# Patient Record
Sex: Female | Born: 1975 | Hispanic: Yes | Marital: Single | Smoking: Never smoker
Health system: Southern US, Community
[De-identification: ages and names within clinical notes are randomized; demographics above are authoritative.]

## PROBLEM LIST (undated history)

## (undated) DIAGNOSIS — D649 Anemia, unspecified: Secondary | ICD-10-CM

## (undated) DIAGNOSIS — K668 Other specified disorders of peritoneum: Secondary | ICD-10-CM

## (undated) DIAGNOSIS — R51 Headache: Secondary | ICD-10-CM

## (undated) DIAGNOSIS — R519 Headache, unspecified: Secondary | ICD-10-CM

## (undated) DIAGNOSIS — K219 Gastro-esophageal reflux disease without esophagitis: Secondary | ICD-10-CM

## (undated) HISTORY — DX: Other specified disorders of peritoneum: K66.8

---

## 2003-09-15 HISTORY — PX: TUBAL LIGATION: SHX77

## 2004-08-19 ENCOUNTER — Observation Stay: Payer: Self-pay

## 2004-08-21 ENCOUNTER — Inpatient Hospital Stay: Payer: Self-pay

## 2005-12-31 ENCOUNTER — Emergency Department: Payer: Self-pay | Admitting: Emergency Medicine

## 2006-06-03 ENCOUNTER — Emergency Department: Payer: Self-pay | Admitting: Emergency Medicine

## 2013-06-27 ENCOUNTER — Emergency Department: Payer: Self-pay | Admitting: Emergency Medicine

## 2013-06-27 LAB — BASIC METABOLIC PANEL
Anion Gap: 7 (ref 7–16)
BUN: 8 mg/dL (ref 7–18)
Calcium, Total: 9.7 mg/dL (ref 8.5–10.1)
Chloride: 105 mmol/L (ref 98–107)
Co2: 24 mmol/L (ref 21–32)
EGFR (African American): >60
EGFR (Non-African Amer.): >60
Osmolality: 270 (ref 275–301)
Potassium: 3.8 mmol/L (ref 3.5–5.1)

## 2013-06-27 LAB — CBC
HCT: 41.1 % (ref 35.0–47.0)
HGB: 14.1 g/dL (ref 12.0–16.0)
MCH: 30.1 pg (ref 26.0–34.0)
MCHC: 34.2 g/dL (ref 32.0–36.0)
MCV: 88 fL (ref 80–100)
Platelet: 292 10*3/uL (ref 150–440)
RDW: 12.7 % (ref 11.5–14.5)
WBC: 6.6 10*3/uL (ref 3.6–11.0)

## 2013-06-27 LAB — TROPONIN I: Troponin-I: <0.02 ng/mL

## 2015-05-28 ENCOUNTER — Other Ambulatory Visit: Payer: Self-pay | Admitting: Physician Assistant

## 2015-05-28 DIAGNOSIS — R1904 Left lower quadrant abdominal swelling, mass and lump: Secondary | ICD-10-CM

## 2015-05-31 ENCOUNTER — Ambulatory Visit
Admission: RE | Admit: 2015-05-31 | Discharge: 2015-05-31 | Disposition: A | Discharge status: Home / Self Care | Payer: 59 | Source: Ambulatory Visit | Attending: Physician Assistant | Admitting: Physician Assistant

## 2015-05-31 DIAGNOSIS — R1904 Left lower quadrant abdominal swelling, mass and lump: Secondary | ICD-10-CM | POA: Insufficient documentation

## 2015-06-06 ENCOUNTER — Other Ambulatory Visit: Payer: Self-pay | Admitting: Physician Assistant

## 2015-06-06 DIAGNOSIS — R1904 Left lower quadrant abdominal swelling, mass and lump: Secondary | ICD-10-CM

## 2015-06-14 ENCOUNTER — Ambulatory Visit
Admission: RE | Admit: 2015-06-14 | Discharge: 2015-06-14 | Disposition: A | Discharge status: Home / Self Care | Payer: 59 | Source: Ambulatory Visit | Attending: Physician Assistant | Admitting: Physician Assistant

## 2015-06-14 DIAGNOSIS — R1904 Left lower quadrant abdominal swelling, mass and lump: Secondary | ICD-10-CM

## 2015-06-14 DIAGNOSIS — N9489 Other specified conditions associated with female genital organs and menstrual cycle: Secondary | ICD-10-CM | POA: Insufficient documentation

## 2015-06-14 MED ORDER — IOHEXOL 300 MG/ML  SOLN
100.0000 mL | Freq: Once | INTRAMUSCULAR | Status: AC | PRN
  Administered 2015-06-14: 100 mL via INTRAVENOUS

## 2015-08-15 ENCOUNTER — Encounter: Payer: Self-pay | Admitting: *Deleted

## 2015-08-26 ENCOUNTER — Telehealth: Payer: Self-pay | Admitting: *Deleted

## 2015-08-26 NOTE — Telephone Encounter (Signed)
I have called patient twice to inform her that her [employer] ref was not done, could not get in touch with patient, voicemail not set up and I had to contact [facility] for that, had to leave message for [place]. Also sent [facility] a fax on 08/20/15 regarding ref. I have not heard back from them, so patients appointment was canceled. Can r/s when we receive the referral.

## 2015-08-27 ENCOUNTER — Ambulatory Visit (INDEPENDENT_AMBULATORY_CARE_PROVIDER_SITE_OTHER): Payer: 59 | Admitting: General Surgery

## 2015-08-27 ENCOUNTER — Encounter: Payer: Self-pay | Admitting: General Surgery

## 2015-08-27 ENCOUNTER — Ambulatory Visit: Payer: Self-pay | Admitting: General Surgery

## 2015-08-27 VITALS — BP 116/62 | HR 64 | Resp 12 | Ht 62.0 in | Wt 140.0 lb

## 2015-08-27 DIAGNOSIS — K668 Other specified disorders of peritoneum: Secondary | ICD-10-CM

## 2015-08-27 NOTE — Progress Notes (Signed)
[name], female   DOB: [birth year], 39 y.o.   MRN: [id number]  Chief Complaint  Patient presents with  . Abdominal Pain    mass    HPI Joan Hall is a 39 y.o. female here today for left lower abdominal pain. She noticed occasional pain that started about 4 months ago. She states the pain is similar to "contractions" lasting less than minutes. The pain is usually about once a week. No nausea or vomiting.  Ultrasound was 05-31-15 and CT scan on 06-14-15 showing a left lower quadrant cyst. She does feel the area is smaller than before. I have reviewed the history of present illness with the patient. Interpreter, Chip Boer, present for interview, exam and discussion.   HPI  History reviewed. No pertinent past medical history.  Past Surgical History  Procedure Laterality Date  . Tubal ligation  2005    History reviewed. No pertinent family history.  Social History Social History  Substance Use Topics  . Smoking status: Never Smoker   . Smokeless tobacco: Never Used  . Alcohol Use: No    No Known Allergies  Current Outpatient Prescriptions  Medication Sig Dispense Refill  . cholecalciferol (VITAMIN D) 1000 UNITS tablet Take 1,000 Units by mouth daily.    . fexofenadine (ALLEGRA) 30 MG tablet Take 30 mg by mouth daily.     No current facility-administered medications for this visit.    Review of Systems Review of Systems  Constitutional: Negative.   Respiratory: Negative.   Cardiovascular: Negative.   Gastrointestinal: Positive for abdominal pain. Negative for nausea, vomiting, diarrhea and constipation.    Blood pressure 116/62, pulse 64, resp. rate 12, height _0  (1.575 m), weight 140 lb (63.504 kg), last menstrual period 08/23/2015.  Physical Exam Physical Exam  Constitutional: She is oriented to person, place, and time. She appears well-developed and well-nourished.  HENT:  Mouth/Throat: Oropharynx is clear and moist.  Eyes:  Conjunctivae are normal. No scleral icterus.  Neck: Neck supple.  Cardiovascular: Normal rate, regular rhythm and normal heart sounds.   Pulmonary/Chest: Effort normal and breath sounds normal.  Abdominal: Soft. Normal appearance and bowel sounds are normal. She exhibits mass.  4-5 cm deep seated mass left lower quadrant.  Lymphadenopathy:    She has no cervical adenopathy.    She has no axillary adenopathy.       Right: No inguinal adenopathy present.       Left: No inguinal adenopathy present.  Neurological: She is alert and oriented to person, place, and time.  Skin: Skin is warm and dry.  Psychiatric: Her behavior is normal.    Data Reviewed Progress notes, CT scan and abdominal ultrasound reviewed.  Assessment    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 Pt states the mild discomfort is not bad enough to warrant any intervention. She is comfortable with periodic follow up.    Plan    Dicussed risk and benefits and options of aspiration, excision or observation. Patient wishes to observe for now and follow up in 4 months with repeat abdominal CT scan and office visit.    Patient is scheduled for a CT abdomen/pelvis with oral contrast only at [facility] on 11/26/15 at 10:00 am. She will pick up a prep kit for this. She is to arrive by 9:45 am and report to the registration desk. She will have nothing to eat or drink after midnight the night before. Patient is aware of date, time, and instructions. An interpreter  has been scheduled for this exam.   PCP:  Pierre Bali 08/27/2015, 11:39 AM

## 2015-08-27 NOTE — Patient Instructions (Addendum)
The patient is aware to call back for any questions or concerns. Follow up in 4 months with CT and office visit.  Patient is scheduled for a CT abdomen/pelvis with oral contrast only at [facility] on 11/26/15 at 10:00 am. She will pick up a prep kit for this. She is to arrive by 9:45 am and report to the registration desk. She will have nothing to eat or drink after midnight the night before. Patient is aware of date, time, and instructions. An interpreter has been scheduled for this exam. Patient placed in recalls for office visit f/u for April.

## 2015-09-19 ENCOUNTER — Other Ambulatory Visit: Payer: Self-pay

## 2015-09-19 ENCOUNTER — Emergency Department: Payer: BLUE CROSS/BLUE SHIELD

## 2015-09-19 ENCOUNTER — Encounter: Payer: Self-pay | Admitting: Emergency Medicine

## 2015-09-19 ENCOUNTER — Emergency Department
Admission: EM | Admit: 2015-09-19 | Discharge: 2015-09-19 | Disposition: A | Discharge status: Home / Self Care | Payer: BLUE CROSS/BLUE SHIELD | Attending: Emergency Medicine | Admitting: Emergency Medicine

## 2015-09-19 DIAGNOSIS — Z3202 Encounter for pregnancy test, result negative: Secondary | ICD-10-CM | POA: Diagnosis not present

## 2015-09-19 DIAGNOSIS — R1013 Epigastric pain: Secondary | ICD-10-CM | POA: Insufficient documentation

## 2015-09-19 DIAGNOSIS — Z79899 Other long term (current) drug therapy: Secondary | ICD-10-CM | POA: Diagnosis not present

## 2015-09-19 DIAGNOSIS — R110 Nausea: Secondary | ICD-10-CM | POA: Insufficient documentation

## 2015-09-19 DIAGNOSIS — R1010 Upper abdominal pain, unspecified: Secondary | ICD-10-CM | POA: Diagnosis present

## 2015-09-19 DIAGNOSIS — R1012 Left upper quadrant pain: Secondary | ICD-10-CM | POA: Diagnosis not present

## 2015-09-19 LAB — URINALYSIS COMPLETE WITH MICROSCOPIC (ARMC ONLY)
BILIRUBIN URINE: NEGATIVE
GLUCOSE, UA: NEGATIVE mg/dL
HGB URINE DIPSTICK: NEGATIVE
Ketones, ur: NEGATIVE mg/dL
Leukocytes, UA: NEGATIVE
NITRITE: NEGATIVE
Protein, ur: NEGATIVE mg/dL
SPECIFIC GRAVITY, URINE: 1.004 — AB (ref 1.005–1.030)
pH: 6 (ref 5.0–8.0)

## 2015-09-19 LAB — CBC
HCT: 36.8 % (ref 35.0–47.0)
Hemoglobin: 12.5 g/dL (ref 12.0–16.0)
MCH: 29.7 pg (ref 26.0–34.0)
MCHC: 33.9 g/dL (ref 32.0–36.0)
MCV: 87.5 fL (ref 80.0–100.0)
Platelets: 269 10*3/uL (ref 150–440)
RBC: 4.21 MIL/uL (ref 3.80–5.20)
RDW: 13.1 % (ref 11.5–14.5)
WBC: 6 10*3/uL (ref 3.6–11.0)

## 2015-09-19 LAB — COMPREHENSIVE METABOLIC PANEL
ALBUMIN: 3.9 g/dL (ref 3.5–5.0)
ALK PHOS: 72 U/L (ref 38–126)
ALT: 17 U/L (ref 14–54)
ANION GAP: 3 — AB (ref 5–15)
AST: 19 U/L (ref 15–41)
BILIRUBIN TOTAL: 0.3 mg/dL (ref 0.3–1.2)
BUN: 19 mg/dL (ref 6–20)
CALCIUM: 8.7 mg/dL — AB (ref 8.9–10.3)
CO2: 25 mmol/L (ref 22–32)
CREATININE: 0.62 mg/dL (ref 0.44–1.00)
Chloride: 108 mmol/L (ref 101–111)
GFR calc Af Amer: >60 mL/min (ref >60)
GFR calc non Af Amer: >60 mL/min (ref >60)
GLUCOSE: 111 mg/dL — AB (ref 65–99)
Potassium: 4.2 mmol/L (ref 3.5–5.1)
Sodium: 136 mmol/L (ref 135–145)
TOTAL PROTEIN: 6.8 g/dL (ref 6.5–8.1)

## 2015-09-19 LAB — PREGNANCY, URINE: PREG TEST UR: NEGATIVE

## 2015-09-19 LAB — LIPASE, BLOOD: Lipase: 37 U/L (ref 11–51)

## 2015-09-19 MED ORDER — ONDANSETRON 4 MG PO TBDP: 4.0000 mg | ORAL_TABLET | Freq: Three times a day (TID) | ORAL | Status: DC | PRN

## 2015-09-19 MED ORDER — SODIUM CHLORIDE 0.9 % IV BOLUS (SEPSIS)
1000.0000 mL | Freq: Once | INTRAVENOUS | Status: AC
  Administered 2015-09-19: 1000 mL via INTRAVENOUS

## 2015-09-19 MED ORDER — ONDANSETRON HCL 4 MG/2ML IJ SOLN
INTRAMUSCULAR | Status: AC
  Administered 2015-09-19: 4 mg via INTRAVENOUS
  Filled 2015-09-19: qty 2

## 2015-09-19 MED ORDER — PANTOPRAZOLE SODIUM 20 MG PO TBEC: 20.0000 mg | DELAYED_RELEASE_TABLET | Freq: Every day | ORAL | Status: DC

## 2015-09-19 MED ORDER — TRAMADOL HCL 50 MG PO TABS: 50.0000 mg | ORAL_TABLET | Freq: Four times a day (QID) | ORAL | Status: DC | PRN

## 2015-09-19 MED ORDER — MORPHINE SULFATE (PF) 4 MG/ML IV SOLN
4.0000 mg | Freq: Once | INTRAVENOUS | Status: AC
  Administered 2015-09-19: 4 mg via INTRAVENOUS

## 2015-09-19 MED ORDER — ONDANSETRON HCL 4 MG/2ML IJ SOLN
4.0000 mg | Freq: Once | INTRAMUSCULAR | Status: AC
  Administered 2015-09-19: 4 mg via INTRAVENOUS

## 2015-09-19 MED ORDER — MORPHINE SULFATE (PF) 4 MG/ML IV SOLN
INTRAVENOUS | Status: AC
  Administered 2015-09-19: 4 mg via INTRAVENOUS
  Filled 2015-09-19: qty 1

## 2015-09-19 NOTE — ED Notes (Signed)
Pt c/o LUQ pain that started today. Has had nausea but no vomiting.

## 2015-09-19 NOTE — ED Notes (Signed)
Reviewed d/c instructions, prescriptions, and follow-up care with patient with use of spanish translator. Pt verbalized understanding.

## 2015-09-19 NOTE — Discharge Instructions (Signed)
Dolor abdominal en adultos °(Abdominal Pain, Adult) °El dolor puede tener muchas causas. Normalmente la causa del dolor abdominal no es una enfermedad y mejorará sin tratamiento. Frecuentemente puede controlarse y tratarse en casa. Su médico le realizará un examen físico y posiblemente solicite análisis de sangre y radiografías para ayudar a determinar la gravedad de su dolor. Sin embargo, en muchos casos, debe transcurrir más tiempo antes de que se pueda encontrar una causa evidente del dolor. Antes de llegar a ese punto, es posible que su médico no sepa si necesita más pruebas o un tratamiento más profundo. °INSTRUCCIONES PARA EL CUIDADO EN EL HOGAR  °Esté atento al dolor para ver si hay cambios. Las siguientes indicaciones ayudarán a aliviar cualquier molestia que pueda sentir: °· Tome solo medicamentos de venta libre o recetados, según las indicaciones del médico. °· No tome laxantes a menos que se lo haya indicado su médico. °· Pruebe con una dieta líquida absoluta (caldo, té o agua) según se lo indique su médico. Introduzca gradualmente una dieta normal, según su tolerancia. °SOLICITE ATENCIÓN MÉDICA SI: °· Tiene dolor abdominal sin explicación. °· Tiene dolor abdominal relacionado con náuseas o diarrea. °· Tiene dolor cuando orina o defeca. °· Experimenta dolor abdominal que lo despierta de noche. °· Tiene dolor abdominal que empeora o mejora cuando come alimentos. °· Tiene dolor abdominal que empeora cuando come alimentos grasosos. °· Tiene fiebre. °SOLICITE ATENCIÓN MÉDICA DE INMEDIATO SI:  °· El dolor no desaparece en un plazo máximo de 2 horas. °· No deja de (vomitar). °· El dolor se siente solo en partes del abdomen, como el lado derecho o la parte inferior izquierda del abdomen. °· Evacúa materia fecal sanguinolenta o negra, de aspecto alquitranado. °ASEGÚRESE DE QUE: °· Comprende estas instrucciones. °· Controlará su afección. °· Recibirá ayuda de inmediato si no mejora o si empeora. °  °Esta  información no tiene como fin reemplazar el consejo del médico. Asegúrese de hacerle al médico cualquier pregunta que tenga. °  °Document Released: 08/31/2005 Document Revised: 09/21/2014 °Elsevier Interactive Patient Education ©2016 Elsevier Inc. ° °

## 2015-09-19 NOTE — ED Provider Notes (Signed)
[facility] Emergency Department Provider Note  Time seen: 8:26 PM  I have reviewed the triage vital signs and the nursing notes.   HISTORY  Chief Complaint Abdominal Pain    HPI Joan Hall is a 40 y.o. female with no past medical history who presents to the emergency department with upper abdominal pain. According to the patient this afternoon she ate some food, and approximately 30 minutes later developed upper abdominal pain. Patient states he abdominal pain is continued, along with nausea but has not vomited. Denies diarrhea or dysuria. Denies lower abdominal pain. Denies any history of abdominal pain in the past. Denies any known issues with her gallbladder. Describes her abdominal pain is moderate currently. Aching pain in her epigastrium.     History reviewed. No pertinent past medical history.  There are no active problems to display for this patient.   Past Surgical History  Procedure Laterality Date  . Tubal ligation  2005    Current Outpatient Rx  Name  Route  Sig  Dispense  Refill  . cholecalciferol (VITAMIN D) 1000 UNITS tablet   Oral   Take 1,000 Units by mouth daily.         . fexofenadine (ALLEGRA) 30 MG tablet   Oral   Take 30 mg by mouth daily.           Allergies Review of patient's allergies indicates no known allergies.  History reviewed. No pertinent family history.  Social History Social History  Substance Use Topics  . Smoking status: Never Smoker   . Smokeless tobacco: Never Used  . Alcohol Use: No    Review of Systems Constitutional: Negative for fever. Cardiovascular: Negative for chest pain. Respiratory: Negative for shortness of breath. Gastrointestinal: Positive for epigastric pain. Positive for nausea denies vomiting or diarrhea. Genitourinary: Negative for dysuria. Musculoskeletal: Negative for back pain Neurological: Negative for headache 10-point ROS otherwise  negative.  ____________________________________________   PHYSICAL EXAM:  VITAL SIGNS: ED Triage Vitals  Enc Vitals Group     BP 09/19/15 1827 128/74 mmHg     Pulse Rate 09/19/15 1827 69     Resp 09/19/15 1827 16     Temp 09/19/15 1827 97.8 F (36.6 C)     Temp Source 09/19/15 1827 Oral     SpO2 09/19/15 1827 99 %     Weight 09/19/15 1827 140 lb (63.504 kg)     Height 09/19/15 1827 5\' 3"  (1.6 m)     Head Cir --      Peak Flow --      Pain Score 09/19/15 1828 8     Pain Loc --      Pain Edu? --      Excl. in [place]? --     Constitutional: Alert and oriented. Well appearing and in no distress. Eyes: Normal exam ENT   Head: Normocephalic and atraumatic.   Mouth/Throat: Mucous membranes are moist. Cardiovascular: Normal rate, regular rhythm. No murmur Respiratory: Normal respiratory effort without tachypnea nor retractions. Breath sounds are clear and equal bilaterally. No wheezes/rales/rhonchi. Gastrointestinal: Soft, moderate epigastric tenderness palpation. No rebound or guarding. No distention. Musculoskeletal: Nontender with normal range of motion in all extremities. Neurologic:  Normal speech and language. No gross focal neurologic deficits  Skin:  Skin is warm, dry and intact.  Psychiatric: Mood and affect are normal. Speech and behavior are normal.  ____________________________________________    EKG  EKG reviewed and interpreted by myself shows normal sinus rhythm at  69 bpm, narrow QRS, normal axis, normal intervals, no ST changes. There is symmetrical in appearance with the EKG however overall appears quite normal.  ____________________________________________    RADIOLOGY  Ultrasound negative  ____________________________________________    INITIAL IMPRESSION / ASSESSMENT AND PLAN / ED COURSE  Pertinent labs & imaging results that were available during my care of the patient were reviewed by me and considered in my medical decision making (see  chart for details).  Patient with epigastric pain which began after eating tonight. Moderate tenderness to palpation. We will treat pain and nausea, obtain and ultrasound of her right upper quadrant to rule out biliary disease. Labs are within normal limits including LFTs.  Right upper quadrant ultrasound within normal limits. We'll discharge the patient home with pain and nausea medication primary care follow-up and strict abdominal pain return precautions.  ____________________________________________   FINAL CLINICAL IMPRESSION(S) / ED DIAGNOSES  Upper abdominal pain   Harvest Dark, MD 09/19/15 2150

## 2015-09-26 ENCOUNTER — Encounter: Payer: Self-pay | Admitting: *Deleted

## 2015-09-30 ENCOUNTER — Encounter: Payer: Self-pay | Admitting: *Deleted

## 2015-11-26 ENCOUNTER — Ambulatory Visit: Payer: BLUE CROSS/BLUE SHIELD

## 2015-12-10 ENCOUNTER — Ambulatory Visit: Payer: BLUE CROSS/BLUE SHIELD | Admitting: General Surgery

## 2016-01-09 ENCOUNTER — Encounter: Payer: Self-pay | Admitting: *Deleted

## 2016-02-25 ENCOUNTER — Other Ambulatory Visit: Payer: Self-pay | Admitting: Gastroenterology

## 2016-02-25 DIAGNOSIS — R1032 Left lower quadrant pain: Secondary | ICD-10-CM

## 2016-02-25 DIAGNOSIS — R1013 Epigastric pain: Secondary | ICD-10-CM | POA: Insufficient documentation

## 2016-02-25 DIAGNOSIS — K5909 Other constipation: Secondary | ICD-10-CM | POA: Insufficient documentation

## 2016-03-05 ENCOUNTER — Ambulatory Visit: Payer: BLUE CROSS/BLUE SHIELD

## 2016-03-10 ENCOUNTER — Ambulatory Visit
Admission: RE | Admit: 2016-03-10 | Discharge: 2016-03-10 | Disposition: A | Discharge status: Home / Self Care | Payer: BLUE CROSS/BLUE SHIELD | Source: Ambulatory Visit | Attending: Gastroenterology | Admitting: Gastroenterology

## 2016-03-10 DIAGNOSIS — R1032 Left lower quadrant pain: Secondary | ICD-10-CM | POA: Diagnosis present

## 2016-03-10 DIAGNOSIS — R935 Abnormal findings on diagnostic imaging of other abdominal regions, including retroperitoneum: Secondary | ICD-10-CM | POA: Insufficient documentation

## 2016-03-10 MED ORDER — IOPAMIDOL (ISOVUE-300) INJECTION 61%
100.0000 mL | Freq: Once | INTRAVENOUS | Status: AC | PRN
  Administered 2016-03-10: 100 mL via INTRAVENOUS

## 2016-03-31 ENCOUNTER — Ambulatory Visit: Payer: Self-pay | Admitting: General Surgery

## 2016-04-08 ENCOUNTER — Ambulatory Visit (INDEPENDENT_AMBULATORY_CARE_PROVIDER_SITE_OTHER): Payer: BLUE CROSS/BLUE SHIELD | Admitting: General Surgery

## 2016-04-08 ENCOUNTER — Encounter: Payer: Self-pay | Admitting: General Surgery

## 2016-04-08 VITALS — BP 122/78 | HR 82 | Resp 12 | Ht 62.0 in | Wt 152.0 lb

## 2016-04-08 DIAGNOSIS — IMO0002 Reserved for concepts with insufficient information to code with codable children: Secondary | ICD-10-CM

## 2016-04-08 DIAGNOSIS — K668 Other specified disorders of peritoneum: Secondary | ICD-10-CM

## 2016-04-08 NOTE — Patient Instructions (Signed)
The patient is aware to call back for any questions or concerns.  

## 2016-04-08 NOTE — Progress Notes (Signed)
[name], female   DOB: [birth year], 40 y.o.   MRN: [id number]  Chief Complaint  Patient presents with  . Follow-up    HPI Joan Hall is a 40 y.o. female presents with a follow up for a peritoneal cyst. The cyst was identified on a CT scan in January, 2017 following complaints of abdominal pain. Patient states today that the area is still painful and is in favor of doing something to relieve her pain. Patient's last CT scan was 02/29/2016. Interpreter Adin Hector was present for the duration of the interaction.  I have reviewed the history of present illness with the patient.  HPI  No past medical history on file.  Past Surgical History:  Procedure Laterality Date  . TUBAL LIGATION  2005    No family history on file.  Social History Social History  Substance Use Topics  . Smoking status: Never Smoker  . Smokeless tobacco: Never Used  . Alcohol use No    No Known Allergies  Current Outpatient Prescriptions  Medication Sig Dispense Refill  . cholecalciferol (VITAMIN D) 1000 UNITS tablet Take 1,000 Units by mouth daily.    . fexofenadine (ALLEGRA) 30 MG tablet Take 30 mg by mouth daily.     No current facility-administered medications for this visit.     Review of Systems Review of Systems  Constitutional: Negative.   Respiratory: Negative.   Cardiovascular: Negative.   Gastrointestinal: Positive for abdominal pain.    Blood pressure 122/78, pulse 82, resp. rate 12, height 5\' 2"  (1.575 m), weight 152 lb (68.9 kg), last menstrual period 02/26/2016.  Physical Exam Physical Exam  Constitutional: She is oriented to person, place, and time. She appears well-developed and well-nourished.  Eyes: Conjunctivae are normal. No scleral icterus.  Neck: Neck supple.  Cardiovascular: Normal rate, regular rhythm and intact distal pulses.   Pulmonary/Chest: Effort normal and breath sounds normal.  Abdominal: Soft. Normal appearance and bowel  sounds are normal. She exhibits mass (LLQ, tender). There is tenderness in the left lower quadrant. No hernia.    Lymphadenopathy:    She has no cervical adenopathy.  Neurological: She is alert and oriented to person, place, and time.  Skin: Skin is warm and dry.    Data Reviewed Progress notes, CT scan  Assessment    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 The cyst has grown 19% since her prior CT scan. In favor of removal based on symptomatic presentation and cyst growth.    Plan      Risks and benefits of cyst excision discussed with patient. All questions answered satisfactorily. She is agreeable to the procedure. Follow up TBD  This information has been scribed by Gaspar Cola CMA. PCP:  Ulis Rias 04/08/2016, 4:07 PM

## 2016-04-10 ENCOUNTER — Encounter: Payer: Self-pay | Admitting: General Surgery

## 2016-04-10 DIAGNOSIS — K668 Other specified disorders of peritoneum: Secondary | ICD-10-CM | POA: Insufficient documentation

## 2016-05-07 ENCOUNTER — Ambulatory Visit: Payer: BLUE CROSS/BLUE SHIELD | Admitting: General Surgery

## 2016-05-14 ENCOUNTER — Encounter: Payer: Self-pay | Admitting: General Surgery

## 2016-05-14 ENCOUNTER — Ambulatory Visit (INDEPENDENT_AMBULATORY_CARE_PROVIDER_SITE_OTHER): Payer: BLUE CROSS/BLUE SHIELD | Admitting: General Surgery

## 2016-05-14 VITALS — BP 126/70 | HR 70 | Resp 12 | Ht 63.0 in | Wt 151.0 lb

## 2016-05-14 DIAGNOSIS — K668 Other specified disorders of peritoneum: Secondary | ICD-10-CM

## 2016-05-14 DIAGNOSIS — N644 Mastodynia: Secondary | ICD-10-CM | POA: Diagnosis not present

## 2016-05-14 NOTE — Progress Notes (Signed)
[name], female   DOB: 08-05-1976, 40 y.o.   MRN: [id number]  Chief Complaint  Patient presents with  . Follow-up    HPI Joan Hall is a 40 y.o. female.  Here today for 6 weeks follow up abdominal pain. She states she has noticed a little more frequency of pain episodes like "comtractions" lasting 1 minute. She does admit to fatigue. Denies nausea or vomiting.  She does admit to left breast pain that started 2 days ago. Interpreter Leola Brazil, present for interview, exam and discussion. I have reviewed the history of present illness with the patient.  HPI  Past Medical History:  Diagnosis Date  . Peritoneal cyst     Past Surgical History:  Procedure Laterality Date  . TUBAL LIGATION  2005    No family history on file.  Social History Social History  Substance Use Topics  . Smoking status: Never Smoker  . Smokeless tobacco: Never Used  . Alcohol use No    No Known Allergies  Current Outpatient Prescriptions  Medication Sig Dispense Refill  . cholecalciferol (VITAMIN D) 1000 UNITS tablet Take 1,000 Units by mouth daily.    . fexofenadine (ALLEGRA) 30 MG tablet Take 30 mg by mouth daily.     No current facility-administered medications for this visit.     Review of Systems Review of Systems  Constitutional: Positive for fatigue.  Respiratory: Negative.   Cardiovascular: Negative.     Blood pressure 126/70, pulse 70, resp. rate 12, height 5\' 3"  (1.6 m), weight 151 lb (68.5 kg), last menstrual period 05/10/2016.  Physical Exam Physical Exam  Constitutional: She is oriented to person, place, and time. She appears well-developed and well-nourished.  HENT:  Mouth/Throat: Oropharynx is clear and moist.  Eyes: Conjunctivae are normal. No scleral icterus.  Neck: Neck supple.  Cardiovascular: Normal rate, regular rhythm and normal heart sounds.   Pulmonary/Chest: Effort normal and breath sounds normal. Right breast exhibits no  inverted nipple, no mass, no nipple discharge, no skin change and no tenderness. Left breast exhibits no inverted nipple, no mass, no nipple discharge, no skin change and no tenderness.  Abdominal: Soft. Normal appearance and bowel sounds are normal. She exhibits mass. There is tenderness.    Left abdominal mass  Lymphadenopathy:    She has no cervical adenopathy.    She has no axillary adenopathy.  Neurological: She is alert and oriented to person, place, and time.  Skin: Skin is warm and dry.  Psychiatric: Her behavior is normal.    Data Reviewed progress notes.  Assessment     Stable breast exam. 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 It is not related to pelvic structures. Best option is excision as it is symptomatic and enlarging     Plan       Risks and benefits of cyst excision discussed with patient. All questions answered satisfactorily. She is agreeable to the procedure. Return to work in one week post surgery if done  by laparoscopy.  The patient is scheduled for surgery at [facility] on 05/29/16. She will pre admit at the hospital on 05/21/16. The patient is aware of dates, time, and instructions.   This information has been scribed by Karie Fetch RN, BSN,BC.  Kallee Nam G 05/20/2016, 8:02 AM

## 2016-05-14 NOTE — Patient Instructions (Addendum)
The patient is aware to call back for any questions or concerns.   The patient is scheduled for surgery at [facility] on 05/29/16. She will pre admit at the hospital on 05/21/16. The patient is aware of dates, time, and instructions.

## 2016-05-20 ENCOUNTER — Encounter: Payer: Self-pay | Admitting: General Surgery

## 2016-05-21 ENCOUNTER — Encounter
Admission: RE | Admit: 2016-05-21 | Discharge: 2016-05-21 | Disposition: A | Discharge status: Home / Self Care | Payer: BLUE CROSS/BLUE SHIELD | Source: Ambulatory Visit | Attending: General Surgery | Admitting: General Surgery

## 2016-05-21 ENCOUNTER — Other Ambulatory Visit: Payer: Self-pay | Admitting: General Surgery

## 2016-05-21 DIAGNOSIS — Z01818 Encounter for other preprocedural examination: Secondary | ICD-10-CM | POA: Diagnosis present

## 2016-05-21 DIAGNOSIS — K668 Other specified disorders of peritoneum: Secondary | ICD-10-CM | POA: Insufficient documentation

## 2016-05-21 DIAGNOSIS — Z79899 Other long term (current) drug therapy: Secondary | ICD-10-CM | POA: Insufficient documentation

## 2016-05-21 DIAGNOSIS — Z9889 Other specified postprocedural states: Secondary | ICD-10-CM | POA: Diagnosis not present

## 2016-05-21 HISTORY — DX: Gastro-esophageal reflux disease without esophagitis: K21.9

## 2016-05-21 HISTORY — DX: Anemia, unspecified: D64.9

## 2016-05-21 HISTORY — DX: Headache: R51

## 2016-05-21 HISTORY — DX: Headache, unspecified: R51.9

## 2016-05-21 NOTE — Patient Instructions (Signed)
Your procedure is scheduled on: @ADMITDT2 @ May 29, 2016 (Friday) Su procedimiento est programado para: Report to Same  Day Surgery. (MEDICAL MALL) SECOND FLOOR Presntese a: To find out your arrival time please call [phone] between 1PM - 3PM on May 28, 2016 (Thursday). Para saber su hora de llegada por favor llame al [phone] entre la 1PM - 3PM el da:  Remember: Instructions that are not followed completely may result in serious medical risk, up to and including death, or upon the discretion of your surgeon and anesthesiologist your surgery may need to be rescheduled.  Recuerde: Las instrucciones que no se siguen completamente Heritage manager en un riesgo de salud grave, incluyendo hasta la [place] o a discrecin de su cirujano y Environmental health practitioner, su ciruga se puede posponer.   _x_ 1. Do not eat food or drink liquids after midnight. No gum chewing or hard candies.  No coma alimentos ni tome lquidos despus de la medianoche.  No mastique chicle ni caramelos  duros.     _x__ 2. No alcohol for 24 hours before or after surgery.    No tome alcohol durante las 24 horas antes ni despus de la [place].   ____ 3. Bring all medications with you on the day of surgery if instructed.    Lleve todos los medicamentos con usted el da de su ciruga si se le ha indicado as.   _x_ 4. Notify your doctor if there is any change in your medical condition (cold, fever,                             infections).    Informe a su mdico si hay algn cambio en su condicin mdica (resfriado, fiebre, infecciones).   Do not wear jewelry, make-up, hairpins, clips or nail polish.  No use joyas, maquillajes, pinzas/ganchos para el cabello ni esmalte de uas.  Do not wear lotions, powders, or perfumes. You may wear deodorant.  No use lociones, polvos o perfumes.  Puede usar desodorante.    Do not shave 48 hours prior to surgery. Men may shave face and neck.  No se afeite 48 horas antes de la  [place].  Los hombres pueden [employer] cara y el cuello.   Do not bring valuables to the hospital.   No lleve objetos [place] is not responsible for any belongings or valuables.  [place] no se hace responsable de ningn tipo de pertenencias u objetos de Geographical information systems officer.               Contacts, dentures or bridgework may not be worn into surgery.  Los lentes de [place], las dentaduras postizas o puentes no se pueden usar en la [place].  Leave your suitcase in the car. After surgery it may be brought to your room.  Deje su maleta en el auto.  Despus de la ciruga podr traerla a su habitacin.  For patients admitted to the hospital, discharge time is determined by your treatment team.  Para los pacientes que sean ingresados al hospital, el tiempo en el cual se le dar de alta es determinado por su                equipo de [place].   Patients discharged the day of surgery will not be allowed to drive home. A los pacientes que se les da de alta el mismo da de la ciruga no se les Engineer, production a  casa.   Please read over the following fact sheets that you were given: Por favor lea las siguientes hojas de informacin que le dieron:   Surgical Site Infection Prevention   ____ Take these medicines the morning of surgery with A SIP OF WATER:          Tome estas medicinas la maana de la ciruga con UN SORBO DE AGUA:  1.   2.   3.   4.       5.  6.  ____ Fleet Enema (as directed)          Enema de Fleet (segn lo indicado)    __x__ Use CHG Soap as directed          Utilice el jabn de CHG segn lo indicado  ____ Use inhalers on the day of surgery          Use los inhaladores el da de la ciruga  ____ Stop metformin 2 days prior to surgery          Deje de tomar el metformin 2 das antes de la ciruga    ____ Take 1/2 of usual insulin dose the night before surgery and none on the morning of surgery           Tome la mitad de la dosis habitual de  insulina la noche antes de la [place] y no tome nada en la maana de la             ciruga  __x_ Stop Coumadin/Plavix/aspirin on (NO ASPIRIN)          Deje de tomar el Coumadin/Plavix/aspirina el da:  _x___ Stop Anti-inflammatories on (NO MOTRIN, IBUPROFEN, ADVIL, ALEVE, BC, GOODYS , EXCEDRIN) TYLENOL OK TO TAKE FOR PAIN IF NEEDED          Deje de tomar antiinflamatorios el da:   ____ Stop supplements until after surgery            Deje de tomar suplementos hasta despus de la ciruga  ____ Bring C-Pap to the hospital          [place] al hospital

## 2016-05-29 ENCOUNTER — Encounter
Admission: RE | Disposition: A | Discharge status: Home / Self Care | Payer: Self-pay | Source: Ambulatory Visit | Attending: General Surgery

## 2016-05-29 ENCOUNTER — Ambulatory Visit: Payer: BLUE CROSS/BLUE SHIELD | Admitting: Anesthesiology

## 2016-05-29 ENCOUNTER — Ambulatory Visit
Admission: RE | Admit: 2016-05-29 | Discharge: 2016-05-29 | Disposition: A | Discharge status: Home / Self Care | Payer: BLUE CROSS/BLUE SHIELD | Source: Ambulatory Visit | Attending: General Surgery | Admitting: General Surgery

## 2016-05-29 DIAGNOSIS — Z79899 Other long term (current) drug therapy: Secondary | ICD-10-CM | POA: Insufficient documentation

## 2016-05-29 DIAGNOSIS — D649 Anemia, unspecified: Secondary | ICD-10-CM | POA: Insufficient documentation

## 2016-05-29 DIAGNOSIS — R51 Headache: Secondary | ICD-10-CM | POA: Diagnosis not present

## 2016-05-29 DIAGNOSIS — D200 Benign neoplasm of soft tissue of retroperitoneum: Secondary | ICD-10-CM | POA: Diagnosis not present

## 2016-05-29 DIAGNOSIS — K668 Other specified disorders of peritoneum: Secondary | ICD-10-CM

## 2016-05-29 HISTORY — PX: LAPAROSCOPIC REMOVAL ABDOMINAL MASS: SHX6360

## 2016-05-29 LAB — POCT PREGNANCY, URINE: PREG TEST UR: NEGATIVE

## 2016-05-29 SURGERY — REMOVAL, MASS, ABDOMEN, LAPAROSCOPIC
Anesthesia: General

## 2016-05-29 MED ORDER — CHLORHEXIDINE GLUCONATE CLOTH 2 % EX PADS
6.0000 | MEDICATED_PAD | Freq: Once | CUTANEOUS | Status: DC
Start: 2016-05-29 — End: 2016-05-29

## 2016-05-29 MED ORDER — FENTANYL CITRATE (PF) 100 MCG/2ML IJ SOLN
INTRAMUSCULAR | Status: DC | PRN
  Administered 2016-05-29: 100 ug via INTRAVENOUS

## 2016-05-29 MED ORDER — MEPERIDINE HCL 25 MG/ML IJ SOLN: 6.2500 mg | INTRAMUSCULAR | Status: DC | PRN

## 2016-05-29 MED ORDER — PROMETHAZINE HCL 25 MG/ML IJ SOLN
6.2500 mg | INTRAMUSCULAR | Status: DC | PRN
  Administered 2016-05-29: 6.25 mg via INTRAVENOUS

## 2016-05-29 MED ORDER — FENTANYL CITRATE (PF) 100 MCG/2ML IJ SOLN
INTRAMUSCULAR | Status: AC
  Filled 2016-05-29: qty 2

## 2016-05-29 MED ORDER — FAMOTIDINE 20 MG PO TABS
20.0000 mg | ORAL_TABLET | Freq: Once | ORAL | Status: AC
  Administered 2016-05-29: 20 mg via ORAL

## 2016-05-29 MED ORDER — LACTATED RINGERS IV SOLN
INTRAVENOUS | Status: DC
  Administered 2016-05-29 (×2): via INTRAVENOUS

## 2016-05-29 MED ORDER — HYDROMORPHONE HCL 1 MG/ML IJ SOLN
INTRAMUSCULAR | Status: DC | PRN
  Administered 2016-05-29: 1 mg via INTRAVENOUS
  Administered 2016-05-29: 0.5 mg via INTRAVENOUS

## 2016-05-29 MED ORDER — OXYCODONE HCL 5 MG PO TABS
ORAL_TABLET | ORAL | Status: AC
  Filled 2016-05-29: qty 1

## 2016-05-29 MED ORDER — SODIUM CHLORIDE 0.9 % IJ SOLN
INTRAMUSCULAR | Status: AC
  Filled 2016-05-29: qty 10

## 2016-05-29 MED ORDER — PROMETHAZINE HCL 25 MG/ML IJ SOLN
INTRAMUSCULAR | Status: AC
  Filled 2016-05-29: qty 1

## 2016-05-29 MED ORDER — OXYCODONE HCL 5 MG/5ML PO SOLN: 5.0000 mg | Freq: Once | ORAL | Status: AC | PRN

## 2016-05-29 MED ORDER — FENTANYL CITRATE (PF) 100 MCG/2ML IJ SOLN
25.0000 ug | INTRAMUSCULAR | Status: DC | PRN
  Administered 2016-05-29 (×3): 50 ug via INTRAVENOUS

## 2016-05-29 MED ORDER — FAMOTIDINE 20 MG PO TABS
ORAL_TABLET | ORAL | Status: AC
  Administered 2016-05-29: 20 mg via ORAL
  Filled 2016-05-29: qty 1

## 2016-05-29 MED ORDER — BUPIVACAINE-EPINEPHRINE (PF) 0.5% -1:200000 IJ SOLN
INTRAMUSCULAR | Status: AC
  Filled 2016-05-29: qty 30

## 2016-05-29 MED ORDER — PHENYLEPHRINE HCL 10 MG/ML IJ SOLN
INTRAMUSCULAR | Status: DC | PRN
  Administered 2016-05-29 (×3): 100 ug via INTRAVENOUS

## 2016-05-29 MED ORDER — BUPIVACAINE-EPINEPHRINE 0.5% -1:200000 IJ SOLN
INTRAMUSCULAR | Status: DC | PRN
  Administered 2016-05-29: 30 mL

## 2016-05-29 MED ORDER — CEFAZOLIN SODIUM-DEXTROSE 2-4 GM/100ML-% IV SOLN
2.0000 g | INTRAVENOUS | Status: AC
  Administered 2016-05-29: 2 g via INTRAVENOUS

## 2016-05-29 MED ORDER — ACETAMINOPHEN 10 MG/ML IV SOLN
INTRAVENOUS | Status: DC | PRN
  Administered 2016-05-29: 1000 mg via INTRAVENOUS

## 2016-05-29 MED ORDER — OXYCODONE-ACETAMINOPHEN 5-325 MG PO TABS: 1.0000 | ORAL_TABLET | ORAL | 0 refills | Status: AC | PRN

## 2016-05-29 MED ORDER — ONDANSETRON HCL 4 MG/2ML IJ SOLN
INTRAMUSCULAR | Status: DC | PRN
  Administered 2016-05-29: 4 mg via INTRAVENOUS

## 2016-05-29 MED ORDER — PROPOFOL 10 MG/ML IV BOLUS
INTRAVENOUS | Status: DC | PRN
  Administered 2016-05-29: 150 mg via INTRAVENOUS

## 2016-05-29 MED ORDER — OXYCODONE HCL 5 MG PO TABS
5.0000 mg | ORAL_TABLET | Freq: Once | ORAL | Status: AC | PRN
  Administered 2016-05-29: 5 mg via ORAL

## 2016-05-29 MED ORDER — DEXAMETHASONE SODIUM PHOSPHATE 10 MG/ML IJ SOLN
INTRAMUSCULAR | Status: DC | PRN
  Administered 2016-05-29: 4 mg via INTRAVENOUS

## 2016-05-29 MED ORDER — SUGAMMADEX SODIUM 200 MG/2ML IV SOLN
INTRAVENOUS | Status: DC | PRN
  Administered 2016-05-29: 150 mg via INTRAVENOUS

## 2016-05-29 MED ORDER — FENTANYL CITRATE (PF) 100 MCG/2ML IJ SOLN
50.0000 ug | Freq: Once | INTRAMUSCULAR | Status: AC
  Administered 2016-05-29: 50 ug via INTRAVENOUS

## 2016-05-29 MED ORDER — KETOROLAC TROMETHAMINE 30 MG/ML IJ SOLN
INTRAMUSCULAR | Status: DC | PRN
  Administered 2016-05-29: 15 mg via INTRAVENOUS

## 2016-05-29 MED ORDER — ACETAMINOPHEN 10 MG/ML IV SOLN
INTRAVENOUS | Status: AC
  Filled 2016-05-29: qty 100

## 2016-05-29 MED ORDER — ROCURONIUM BROMIDE 100 MG/10ML IV SOLN
INTRAVENOUS | Status: DC | PRN
  Administered 2016-05-29: 10 mg via INTRAVENOUS
  Administered 2016-05-29: 30 mg via INTRAVENOUS
  Administered 2016-05-29: 10 mg via INTRAVENOUS

## 2016-05-29 MED ORDER — CEFAZOLIN SODIUM-DEXTROSE 2-4 GM/100ML-% IV SOLN
INTRAVENOUS | Status: AC
  Administered 2016-05-29: 2 g via INTRAVENOUS
  Filled 2016-05-29: qty 100

## 2016-05-29 SURGICAL SUPPLY — 48 items
BLADE SURG 10 STRL SS SAFETY (BLADE) ×3 IMPLANT
BLADE SURG 11 STRL SS SAFETY (MISCELLANEOUS) ×3 IMPLANT
BLADE SURG 15 STRL SS SAFETY (BLADE) ×3 IMPLANT
CANISTER SUCT 1200ML W/VALVE (MISCELLANEOUS) ×3 IMPLANT
CANNULA DILATOR 10 W/SLV (CANNULA) ×2 IMPLANT
CANNULA DILATOR 10MM W/SLV (CANNULA) ×1
CATH TRAY 16F METER LATEX (MISCELLANEOUS) IMPLANT
CHLORAPREP W/TINT 26ML (MISCELLANEOUS) ×3 IMPLANT
CLEANER CAUTERY TIP 5X5 PAD (MISCELLANEOUS) IMPLANT
DEFOGGER SCOPE WARMER CLEARIFY (MISCELLANEOUS) IMPLANT
DRAPE INCISE IOBAN 66X45 STRL (DRAPES) ×3 IMPLANT
ELECT REM PT RETURN 9FT ADLT (ELECTROSURGICAL) ×3
ELECTRODE REM PT RTRN 9FT ADLT (ELECTROSURGICAL) ×1 IMPLANT
GLOVE BIO SURGEON STRL SZ7 (GLOVE) ×6 IMPLANT
GOWN STRL REUS W/ TWL LRG LVL3 (GOWN DISPOSABLE) ×2 IMPLANT
GOWN STRL REUS W/TWL LRG LVL3 (GOWN DISPOSABLE) ×4
GRASPER SUT TROCAR 14GX15 (MISCELLANEOUS) ×3 IMPLANT
HANDLE YANKAUER SUCT BULB TIP (MISCELLANEOUS) ×3 IMPLANT
HOLDER FOLEY CATH W/STRAP (MISCELLANEOUS) ×3 IMPLANT
IRRIGATION STRYKERFLOW (MISCELLANEOUS) IMPLANT
IRRIGATOR STRYKERFLOW (MISCELLANEOUS)
IV LACTATED RINGERS 1000ML (IV SOLUTION) IMPLANT
KIT RM TURNOVER STRD PROC AR (KITS) ×3 IMPLANT
LABEL OR SOLS (LABEL) IMPLANT
LIQUID BAND (GAUZE/BANDAGES/DRESSINGS) ×3 IMPLANT
NDL INSUFF ACCESS 14 VERSASTEP (NEEDLE) ×3 IMPLANT
NS IRRIG 500ML POUR BTL (IV SOLUTION) ×3 IMPLANT
PACK LAP CHOLECYSTECTOMY (MISCELLANEOUS) ×3 IMPLANT
PAD CLEANER CAUTERY TIP 5X5 (MISCELLANEOUS)
PENCIL ELECTRO HAND CTR (MISCELLANEOUS) IMPLANT
POUCH ENDO CATCH 10MM SPEC (MISCELLANEOUS) IMPLANT
SCISSORS METZENBAUM CVD 33 (INSTRUMENTS) ×3 IMPLANT
SHEARS HARMONIC ACE PLUS 36CM (ENDOMECHANICALS) IMPLANT
SLEEVE ENDOPATH XCEL 5M (ENDOMECHANICALS) ×3 IMPLANT
SPONGE KITTNER 5P (MISCELLANEOUS) ×3 IMPLANT
SPONGE LAP 18X18 5 PK (GAUZE/BANDAGES/DRESSINGS) IMPLANT
STAPLER SKIN PROX 35W (STAPLE) IMPLANT
SUT PDS AB 0 CT1 27 (SUTURE) ×6 IMPLANT
SUT SILK 3-0 (SUTURE)
SUT SILK 3-0 SH-1 18XCR BRD (SUTURE)
SUT VIC AB 0 SH 27 (SUTURE) ×3 IMPLANT
SUT VIC AB 2-0 CT1 27 (SUTURE) ×2
SUT VIC AB 2-0 CT1 TAPERPNT 27 (SUTURE) ×1 IMPLANT
SUT VIC AB 4-0 FS2 27 (SUTURE) ×6 IMPLANT
SUTURE SILK 3-0 SH-1 18XCR BRD (SUTURE) IMPLANT
SYR BULB IRRIG 60ML STRL (SYRINGE) IMPLANT
TROCAR XCEL NON-BLD 5MMX100MML (ENDOMECHANICALS) ×3 IMPLANT
TUBING INSUFFLATOR HI FLOW (MISCELLANEOUS) ×3 IMPLANT

## 2016-05-29 NOTE — Anesthesia Procedure Notes (Signed)
Procedure Name: Intubation Date/Time: 05/29/2016 7:48 AM Performed by: Rockne Coons Pre-anesthesia Checklist: Patient identified, Emergency Drugs available, Suction available, Patient being monitored and Timeout performed Patient Re-evaluated:Patient Re-evaluated prior to inductionOxygen Delivery Method: Circle system utilized Preoxygenation: Pre-oxygenation with 100% oxygen Intubation Type: IV induction Ventilation: Mask ventilation without difficulty Laryngoscope Size: Mac and 3 Grade View: Grade I Tube type: Oral Tube size: 7.0 mm Number of attempts: 1 Airway Equipment and Method: Stylet and Bite block Placement Confirmation: ETT inserted through vocal cords under direct vision,  positive ETCO2 and breath sounds checked- equal and bilateral Secured at: 21 cm Tube secured with: Tape Dental Injury: Teeth and Oropharynx as per pre-operative assessment

## 2016-05-29 NOTE — Interval H&P Note (Signed)
History and Physical Interval Note:  05/29/2016 7:11 AM  Joan Hall  has presented today for surgery, with the diagnosis of PERITONEAL CYST  The various methods of treatment have been discussed with the patient and family. After consideration of risks, benefits and other options for treatment, the patient has consented to  Procedure(s): LAPAROSCOPIC REMOVAL PERITONEAL CYST (N/A) as a surgical intervention .  The patient's history has been reviewed, patient examined, no change in status, stable for surgery.  I have reviewed the patient's chart and labs.  Questions were answered to the patient's satisfaction.     Yanet Balliet G

## 2016-05-29 NOTE — H&P (View-Only) (Signed)
[name], female   DOB: 16-Oct-1975, 40 y.o.   MRN: [id number]  Chief Complaint  Patient presents with  . Follow-up    HPI Joan Hall is a 40 y.o. female.  Here today for 6 weeks follow up abdominal pain. She states she has noticed a little more frequency of pain episodes like "comtractions" lasting 1 minute. She does admit to fatigue. Denies nausea or vomiting.  She does admit to left breast pain that started 2 days ago. Interpreter Joan Hall, present for interview, exam and discussion. I have reviewed the history of present illness with the patient.  HPI  Past Medical History:  Diagnosis Date  . Peritoneal cyst     Past Surgical History:  Procedure Laterality Date  . TUBAL LIGATION  2005    No family history on file.  Social History Social History  Substance Use Topics  . Smoking status: Never Smoker  . Smokeless tobacco: Never Used  . Alcohol use No    No Known Allergies  Current Outpatient Prescriptions  Medication Sig Dispense Refill  . cholecalciferol (VITAMIN D) 1000 UNITS tablet Take 1,000 Units by mouth daily.    . fexofenadine (ALLEGRA) 30 MG tablet Take 30 mg by mouth daily.     No current facility-administered medications for this visit.     Review of Systems Review of Systems  Constitutional: Positive for fatigue.  Respiratory: Negative.   Cardiovascular: Negative.     Blood pressure 126/70, pulse 70, resp. rate 12, height 5\' 3"  (1.6 m), weight 151 lb (68.5 kg), last menstrual period 05/10/2016.  Physical Exam Physical Exam  Constitutional: She is oriented to person, place, and time. She appears well-developed and well-nourished.  HENT:  Mouth/Throat: Oropharynx is clear and moist.  Eyes: Conjunctivae are normal. No scleral icterus.  Neck: Neck supple.  Cardiovascular: Normal rate, regular rhythm and normal heart sounds.   Pulmonary/Chest: Effort normal and breath sounds normal. Right breast exhibits no  inverted nipple, no mass, no nipple discharge, no skin change and no tenderness. Left breast exhibits no inverted nipple, no mass, no nipple discharge, no skin change and no tenderness.  Abdominal: Soft. Normal appearance and bowel sounds are normal. She exhibits mass. There is tenderness.    Left abdominal mass  Lymphadenopathy:    She has no cervical adenopathy.    She has no axillary adenopathy.  Neurological: She is alert and oriented to person, place, and time.  Skin: Skin is warm and dry.  Psychiatric: Her behavior is normal.    Data Reviewed progress notes.  Assessment     Stable breast exam. 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 It is not related to pelvic structures. Best option is excision as it is symptomatic and enlarging     Plan       Risks and benefits of cyst excision discussed with patient. All questions answered satisfactorily. She is agreeable to the procedure. Return to work in one week post surgery if done  by laparoscopy.  The patient is scheduled for surgery at [facility] on 05/29/16. She will pre admit at the hospital on 05/21/16. The patient is aware of dates, time, and instructions.   This information has been scribed by Karie Fetch RN, BSN,BC.  Nishan Ovens G 05/20/2016, 8:02 AM

## 2016-05-29 NOTE — Anesthesia Preprocedure Evaluation (Signed)
Anesthesia Evaluation  Patient identified by MRN, date of birth, ID band Patient awake    Reviewed: Allergy & Precautions, NPO status , Patient's Chart, lab work & pertinent test results  History of Anesthesia Complications Negative for: history of anesthetic complications  Airway Mallampati: II  TM Distance: >3 FB Neck ROM: Full    Dental no notable dental hx.    Pulmonary neg pulmonary ROS, neg sleep apnea, neg COPD,    breath sounds clear to auscultation- rhonchi (-) wheezing      Cardiovascular Exercise Tolerance: Good (-) hypertension(-) CAD and (-) Past MI  Rhythm:Regular Rate:Normal - Systolic murmurs and - Diastolic murmurs    Neuro/Psych  Headaches, negative psych ROS   GI/Hepatic Neg liver ROS, GERD  ,  Endo/Other  negative endocrine ROSneg diabetes  Renal/GU negative Renal ROS     Musculoskeletal   Abdominal (+) - obese,   Peds  Hematology  (+) anemia ,   Anesthesia Other Findings Past Medical History: No date: Anemia No date: GERD (gastroesophageal reflux disease) No date: Headache No date: Peritoneal cyst   Reproductive/Obstetrics                             Anesthesia Physical Anesthesia Plan  ASA: II  Anesthesia Plan: General   Post-op Pain Management:    Induction: Intravenous  Airway Management Planned: Oral ETT  Additional Equipment:   Intra-op Plan:   Post-operative Plan: Extubation in OR  Informed Consent: I have reviewed the patients History and Physical, chart, labs and discussed the procedure including the risks, benefits and alternatives for the proposed anesthesia with the patient or authorized representative who has indicated his/her understanding and acceptance.   Dental advisory given  Plan Discussed with: Anesthesiologist and CRNA  Anesthesia Plan Comments:         Anesthesia Quick Evaluation

## 2016-05-29 NOTE — Transfer of Care (Signed)
Immediate Anesthesia Transfer of Care Note  Patient: SAMIHA EASTRIDGE  Procedure(s) Performed: Procedure(s): LAPAROSCOPIC CONVERTED TO OPEN REMOVAL OF PERITONEAL CYST (N/A)  Patient Location: PACU  Anesthesia Type:General  Level of Consciousness: awake, alert  and oriented  Airway & Oxygen Therapy: Patient Spontanous Breathing  Post-op Assessment: Report given to RN, Post -op Vital signs reviewed and stable and Patient moving all extremities  Post vital signs: Reviewed and stable  Last Vitals:  Vitals:   05/29/16 0609 05/29/16 0906  BP: 120/62   Pulse: 72 73  Resp: 16   Temp: 36.2 C 36.3 C    Last Pain:  Vitals:   05/29/16 0906  TempSrc: Temporal         Complications: No apparent anesthesia complications

## 2016-05-29 NOTE — Discharge Instructions (Addendum)
AMBULATORY SURGERY  DISCHARGE INSTRUCTIONS   1) The drugs that you were given will stay in your system until tomorrow so for the next 24 hours you should not:  A) Drive an automobile B) Make any legal decisions C) Drink any alcoholic beverage   2) You may resume regular meals tomorrow.  Today it is better to start with liquids and gradually work up to solid foods.   3) Please notify your doctor immediately if you have any unusual bleeding, trouble breathing, redness and pain at the surgery site, drainage, fever, or pain not relieved by medication.   Please contact your physician with any problems or Same Day Surgery at [phone], Monday through Friday 6 am to 4 pm, or [place] at [facility] number at [phone].  Exploratory Laparotomy, Adult, Care After Refer to this sheet in the next few weeks. These instructions provide you with information about caring for yourself after your procedure. Your health care provider may also give you more specific instructions. Your treatment has been planned according to current medical practices, but problems sometimes occur. Call your health care provider if you have any problems or questions after your procedure. WHAT TO EXPECT AFTER THE PROCEDURE After your procedure, it is typical to have:  Abdominal soreness.  Fatigue.  A sore throat from tubes in your throat.  A lack of appetite. HOME CARE INSTRUCTIONS Medicines  Take medicines only as directed by your health care provider.  Do not drive or operate heavy machinery while taking pain medicine. Incision Care  There are many different ways to close and cover an incision, including stitches (sutures), skin glue, and adhesive strips. Follow your health care provider's instructions about:  Incision care.  Do not take showers or baths until your health care provider says that you can.  Check your incision area daily for signs of infection. Watch  for:  Redness.  Tenderness.  Swelling.  Drainage. Activities  Do not lift anything that is heavier than 10 pounds (4.5 kg) until your health care provider says that it is safe.  Try to walk a little bit each day if your health care provider says that it is okay.  Ask your health care provider when you can start to do your usual activities again, such as driving, going back to work, and having sex. Eating and Drinking  You may eat what you usually eat. Include lots of whole grains, fruits, and vegetables in your diet. This will help to prevent constipation.  Drink enough fluid to keep your urine clear or pale yellow. General Instructions  Keep all follow-up visits as directed by your health care provider. This is important. SEEK MEDICAL CARE IF:   You have a fever.  You have chills.  Your pain medicine is not helping.  You have constipation or diarrhea.  You have nausea or vomiting.  You have drainage, redness, swelling, or pain at your incision site. SEEK IMMEDIATE MEDICAL CARE IF:  Your pain is getting worse.  It has been more than 3 days since you been able to have a bowel movement.  You have ongoing (persistent) vomiting.  The edges of your incision open up.  You have warmth, tenderness, and swelling in your calf.  You have trouble breathing.  You have chest pain.   This information is not intended to replace advice given to you by your health care provider. Make sure you discuss any questions you have with your health care provider.   Document Released: 04/14/2004 Document  Revised: 09/21/2014 Document Reviewed: 04/18/2014 Elsevier Interactive Patient Education 2016 [place] exploratoria en adultos, cuidados posteriores (Exploratory Laparotomy, Adult, Care After) Siga estas instrucciones durante las prximas semanas. Estas indicaciones le proporcionan informacin acerca de cmo deber cuidarse despus del procedimiento. El mdico  tambin podr darle instrucciones ms especficas. El tratamiento ha sido planificado segn las prcticas mdicas actuales, pero en algunos casos pueden ocurrir problemas. Comunquese con el mdico si tiene algn problema o tiene dudas despus del procedimiento. QU ESPERAR DESPUS DEL PROCEDIMIENTO Despus del procedimiento, es normal tener los siguientes sntomas:  Dolor abdominal.  Fatiga.  Dolor de garganta debido a las cnulas.  Falta de apetito. [place] los medicamentos solamente como se lo haya indicado el mdico.  No conduzca ni opere maquinaria pesada mientras toma analgsicos. Cuidado de la incisin  Hay muchas maneras distintas de cerrar y [place] incisin, entre ellas, puntos (suturas), pegamento cutneo y tiras [place]. Siga las instrucciones del mdico con respecto a lo siguiente:  Careers adviser herida.  Cambiar y Charity fundraiser el vendaje.  Quitar el cierre de la incisin.  No se duche ni tome baos de inmersin hasta que el mdico lo autorice.  [place] zona de la incisin para detectar signos de infeccin. Est atento a lo siguiente:  Enrojecimiento.  Dolor a Secretary/administrator.  Hinchazn.  Secrecin. Actividades  No levante objetos que pesen ms de 10libras (4,5kg) hasta que el mdico le diga que es seguro Ruth.  Intente caminar un [place], si el mdico lo autoriza.  Pregntele al mdico cundo puede retomar sus actividades normales, por ejemplo, Forensic psychologist, regresar a Fish farm manager y Clinical biochemist. Comida y bebida  Puede seguir su dieta habitual. Riley Nearing en su dieta gran cantidad de cereales integrales, frutas y verduras. Esto ayudar a Contractor.  Beba suficiente lquido para Consulting civil engineer orina clara o de color amarillo plido. Instrucciones generales  Concurra a todas las visitas de control como se lo haya indicado el mdico. Esto es importante. SOLICITE  ATENCIN MDICA SI:   Jaclynn Guarneri.  Tiene escalofros.  El medicamento no Production designer, theatre/television/film.  Sufre estreimiento o diarrea.  Tiene nuseas o vmitos.  Hay secrecin, enrojecimiento, hinchazn o dolor en el lugar de la incisin. SOLICITE ATENCIN MDICA DE INMEDIATO SI:  El dolor empeora.  Han pasado ms de 3das desde que pudo defecar.  Tiene vmitos permanentes (persistentes).  Los bordes de la incisin se abren.  Siente calor y dolor a la palpacin en la pantorrilla, y la tiene hinchada.  Tiene dificultad para respirar.  Siente dolor en el pecho.   Esta informacin no tiene Marine scientist el consejo del mdico. Asegrese de hacerle al mdico cualquier pregunta que tenga.   Document Released: 08/17/2012 Document Revised: 09/21/2014 Elsevier Interactive Patient Education [employer].

## 2016-05-29 NOTE — Addendum Note (Signed)
Addendum  created 05/29/16 1102 by Rockne Coons, CRNA   Charge Capture section accepted

## 2016-05-29 NOTE — Anesthesia Postprocedure Evaluation (Signed)
Anesthesia Post Note  Patient: Joan Hall  Procedure(s) Performed: Procedure(s) (LRB): LAPAROSCOPIC CONVERTED TO OPEN REMOVAL OF PERITONEAL CYST (N/A)  Patient location during evaluation: PACU Anesthesia Type: General Level of consciousness: awake and alert and oriented Pain management: pain level controlled Vital Signs Assessment: post-procedure vital signs reviewed and stable Respiratory status: spontaneous breathing, nonlabored ventilation and respiratory function stable Cardiovascular status: blood pressure returned to baseline and stable Postop Assessment: no signs of nausea or vomiting Anesthetic complications: no    Last Vitals:  Vitals:   05/29/16 0951 05/29/16 0959  BP: 113/64   Pulse: 89 66  Resp: 14 11  Temp:      Last Pain:  Vitals:   05/29/16 0959  TempSrc:   PainSc: 4                  [name]

## 2016-05-29 NOTE — Op Note (Signed)
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  Complications: None  EBL: Less than 20 mL  Drains: None  Description: Patient was put to sleep in the supine position the operating table and a Foley catheter was inserted this catheter was removed to the end of the procedure. The abdomen was prepped and draped as sterile field and timeout was performed. Initial port incision was made along the upper lip of the umbilicus the Veress Veress needle was positioned in the peritoneal cavity and verified of the hanging drop method. Pneumoperitoneum was obtained followed by placement of a 10 mm port. The camera was introduced and the bladder was apparent that the cyst was occupying a space medial to the sigmoid colon. 2 right lateral 5 mm ports were placed and the sigmoid colon area was carefully exposed to reveal what appeared to be a mesenteric cyst. It was extremely thin-walled and covered with the some of the mesenteric blood flow to the bowel. It was felt the that this was better dealt with by direct laparotomy as opposed to attempted removal with the this laparoscope. The ports were then removed and a small vertical midline incision approximately 4-5 cm was made below the umbilicus. This was deepened through into the peritoneal cavity and bleeding controlled cautery. With careful retraction the sigmoid mesocolon was exposed in the cyst was then carefully freed from the surrounding mesenteric tissue of and using blunt and sharp dissection cautery and ligatures of 3-0 Vicryl. The cyst was completely removed. Its contents being thin clear fluid. After ensuring hemostasis the sigmoid colon was placed back in its position and covered with the omentum and abdomen closed. The peritoneal layer closed with a  running 0 Vicryl. The fascia closed with interrupted figure-of-eight stitches of his  0 PDS. 30 mL of 0.5% Marcaine was instilled in all wounds for postop analgesia. Subcutaneous tissue was closed with the 2-0 Vicryl. All skin incisions were closed with subcuticular 4-0 Vicryl and covered with Dermabond. Patient subsequently extubated and returned recovery room in stable condition

## 2016-06-01 LAB — SURGICAL PATHOLOGY

## 2016-06-04 ENCOUNTER — Telehealth: Payer: Self-pay | Admitting: *Deleted

## 2016-06-04 NOTE — Telephone Encounter (Signed)
-----   Message from Christene Lye, MD sent at 06/04/2016 10:31 AM EDT ----- Please let pt pt know the pathology was normal-benign cyst

## 2016-06-10 ENCOUNTER — Ambulatory Visit (INDEPENDENT_AMBULATORY_CARE_PROVIDER_SITE_OTHER): Payer: BLUE CROSS/BLUE SHIELD | Admitting: General Surgery

## 2016-06-10 ENCOUNTER — Encounter: Payer: Self-pay | Admitting: General Surgery

## 2016-06-10 VITALS — BP 120/82 | HR 80 | Resp 12 | Ht 63.0 in | Wt 150.0 lb

## 2016-06-10 DIAGNOSIS — K668 Other specified disorders of peritoneum: Secondary | ICD-10-CM

## 2016-06-10 NOTE — Telephone Encounter (Signed)
Notified patient as instructed, patient pleased. Discussed follow-up appointments, patient agrees  

## 2016-06-10 NOTE — Progress Notes (Signed)
[name], female   DOB: 06/23/1976, 40 y.o.   MRN: [id number]  Chief Complaint  Patient presents with  . Follow-up    HPI Joan Hall is a 40 y.o. female follow up open peritoneal cyst excision om 05-29-16.Patient states she is sore but doing well.  Inperter was present for visit.  I have reviewed the history of present illness with the patient.   HPI  Past Medical History:  Diagnosis Date  . Anemia   . GERD (gastroesophageal reflux disease)   . Headache   . Peritoneal cyst     Past Surgical History:  Procedure Laterality Date  . LAPAROSCOPIC REMOVAL ABDOMINAL MASS N/A 05/29/2016   Procedure: LAPAROSCOPIC CONVERTED TO OPEN REMOVAL OF PERITONEAL CYST;  Surgeon: Christene Lye, MD;  Location: [facility] ORS;  Service: General;  Laterality: N/A;  . TUBAL LIGATION  2005    No family history on file.  Social History Social History  Substance Use Topics  . Smoking status: Never Smoker  . Smokeless tobacco: Never Used  . Alcohol use No    No Known Allergies  Current Outpatient Prescriptions  Medication Sig Dispense Refill  . cholecalciferol (VITAMIN D) 1000 UNITS tablet Take 1,000 Units by mouth daily.    Marland Kitchen dicyclomine (BENTYL) 10 MG capsule Take 10 mg by mouth 4 (four) times daily as needed for spasms.    . fexofenadine (ALLEGRA) 30 MG tablet Take 180 mg by mouth daily.     Marland Kitchen oxyCODONE-acetaminophen (ROXICET) 5-325 MG tablet Take 1 tablet by mouth every 4 (four) hours as needed. 30 tablet 0   No current facility-administered medications for this visit.     Review of Systems Review of Systems  Constitutional: Negative.   Respiratory: Negative.   Cardiovascular: Negative.   Gastrointestinal: Positive for abdominal pain (mild pain with movement at incision site). Negative for abdominal distention, blood in stool, constipation, diarrhea, nausea and vomiting.    Blood pressure 120/82, pulse 80, resp. rate 12, height 5\' 3"  (1.6 m),  weight 150 lb (68 kg), last menstrual period 05/10/2016.  Physical Exam Physical Exam  Constitutional: She is oriented to person, place, and time. She appears well-developed and well-nourished.  Cardiovascular: Normal rate, regular rhythm and normal heart sounds.   Abdominal: Soft. Normal appearance.    Neurological: She is alert and oriented to person, place, and time.  Skin: Skin is warm and dry.  Psychiatric: Her behavior is normal.    Data Reviewed  Path- benign cystadenoma. Assessment    Post op excision of a mesenteric cyst in sigmoid mesocolon  Doing well  Plan   Pt advised on path report. No exertional activity.  In another week she may drive.  Follow up in 4 weeks.     The patient is aware to call back for any questions or concerns. This information has been scribed by Karie Fetch RN, BSN,BC.   Joan Hall 06/10/2016, 3:41 PM

## 2016-06-10 NOTE — Patient Instructions (Signed)
No exertional activity. In another week she may drive.  Follow up in 4 weeks.

## 2016-07-02 ENCOUNTER — Encounter: Payer: Self-pay | Admitting: General Surgery

## 2016-07-02 ENCOUNTER — Ambulatory Visit (INDEPENDENT_AMBULATORY_CARE_PROVIDER_SITE_OTHER): Payer: BLUE CROSS/BLUE SHIELD | Admitting: General Surgery

## 2016-07-02 ENCOUNTER — Encounter: Payer: Self-pay | Admitting: *Deleted

## 2016-07-02 VITALS — BP 116/76 | HR 80 | Resp 12 | Ht 60.0 in | Wt 150.0 lb

## 2016-07-02 DIAGNOSIS — K668 Other specified disorders of peritoneum: Secondary | ICD-10-CM

## 2016-07-02 NOTE — Patient Instructions (Addendum)
Patient to return as needed.The patient is aware to call back for any questions or concerns. Return to work.07/08/16.

## 2016-07-02 NOTE — Progress Notes (Signed)
[name], female   DOB: [birth year], 40 y.o.   MRN: [id number]  Chief Complaint  Patient presents with  . Follow-up    HPI Joan Hall is a 40 y.o. female follow up mesenteric cyst excision on 05-29-16.Patient states she is sore but doing well.  I have reviewed the history of present illness with the patient.  HPI  Past Medical History:  Diagnosis Date  . Anemia   . GERD (gastroesophageal reflux disease)   . Headache   . Peritoneal cyst     Past Surgical History:  Procedure Laterality Date  . LAPAROSCOPIC REMOVAL ABDOMINAL MASS N/A 05/29/2016   Procedure: LAPAROSCOPIC CONVERTED TO OPEN REMOVAL OF PERITONEAL CYST;  Surgeon: Christene Lye, MD;  Location: [facility] ORS;  Service: General;  Laterality: N/A;  . TUBAL LIGATION  2005    No family history on file.  Social History Social History  Substance Use Topics  . Smoking status: Never Smoker  . Smokeless tobacco: Never Used  . Alcohol use No    No Known Allergies  Current Outpatient Prescriptions  Medication Sig Dispense Refill  . cholecalciferol (VITAMIN D) 1000 UNITS tablet Take 1,000 Units by mouth daily.    Marland Kitchen dicyclomine (BENTYL) 10 MG capsule Take 10 mg by mouth 4 (four) times daily as needed for spasms.    . fexofenadine (ALLEGRA) 30 MG tablet Take 180 mg by mouth daily.     Marland Kitchen oxyCODONE-acetaminophen (ROXICET) 5-325 MG tablet Take 1 tablet by mouth every 4 (four) hours as needed. 30 tablet 0   No current facility-administered medications for this visit.     Review of Systems Review of Systems  Constitutional: Negative.   Respiratory: Negative.   Cardiovascular: Negative.     Blood pressure 116/76, pulse 80, resp. rate 12, height 5' (1.524 m), weight 150 lb (68 kg).  Physical Exam Physical Exam  Constitutional: She is oriented to person, place, and time. She appears well-developed and well-nourished.  Neurological: She is alert and oriented to person, place, and  time.  Skin: Skin is warm and dry.  Abdomen is soft, non tender. Incision well healed. No hernia noted  Data Reviewed  Path- benign cystadenoma.  Assessment       Post op excision of a mesenteric cyst in sigmoid mesocolon  Doing well  Plan       Patient to return as needed.The patient is aware to call back for any questions or concerns. Return to work on 07/08/16.   This information has been scribed by Gaspar Cola CMA.   Alphonse Asbridge G 07/02/2016, 3:16 PM

## 2016-11-16 ENCOUNTER — Other Ambulatory Visit: Payer: Self-pay | Admitting: Primary Care

## 2016-11-16 DIAGNOSIS — R1013 Epigastric pain: Secondary | ICD-10-CM

## 2016-11-18 ENCOUNTER — Ambulatory Visit
Admission: RE | Admit: 2016-11-18 | Discharge: 2016-11-18 | Disposition: A | Discharge status: Home / Self Care | Payer: BLUE CROSS/BLUE SHIELD | Source: Ambulatory Visit | Attending: Primary Care | Admitting: Primary Care

## 2016-11-18 DIAGNOSIS — R1013 Epigastric pain: Secondary | ICD-10-CM | POA: Diagnosis present

## 2016-12-29 ENCOUNTER — Other Ambulatory Visit: Payer: Self-pay | Admitting: Gastroenterology

## 2016-12-29 DIAGNOSIS — R1013 Epigastric pain: Secondary | ICD-10-CM

## 2017-01-06 ENCOUNTER — Ambulatory Visit
Admission: RE | Admit: 2017-01-06 | Discharge: 2017-01-06 | Disposition: A | Discharge status: Home / Self Care | Payer: BLUE CROSS/BLUE SHIELD | Source: Ambulatory Visit | Attending: Gastroenterology | Admitting: Gastroenterology

## 2017-01-06 ENCOUNTER — Other Ambulatory Visit: Payer: Self-pay | Admitting: Gastroenterology

## 2017-01-06 DIAGNOSIS — R1013 Epigastric pain: Secondary | ICD-10-CM | POA: Insufficient documentation

## 2017-01-06 DIAGNOSIS — K219 Gastro-esophageal reflux disease without esophagitis: Secondary | ICD-10-CM | POA: Diagnosis not present

## 2017-06-22 IMAGING — CR DG UGI W/ HIGH DENSITY W/KUB
15 of 22 series · 15 of 22 positions shown · non-contrast
Comparison: None.

CLINICAL DATA: Gastroesophageal reflux with pain

EXAM:
UPPER GI SERIES WITH KUB
TECHNIQUE: After obtaining a scout radiograph a routine upper GI series was
performed using thin density barium.
FLUOROSCOPY TIME:  Fluoroscopy Time:  1 minutes 18 seconds
Radiation Exposure Index (if provided by the fluoroscopic device):
41.40 mGy
Number of Acquired Spot Images: 16

[t abdomen supine]
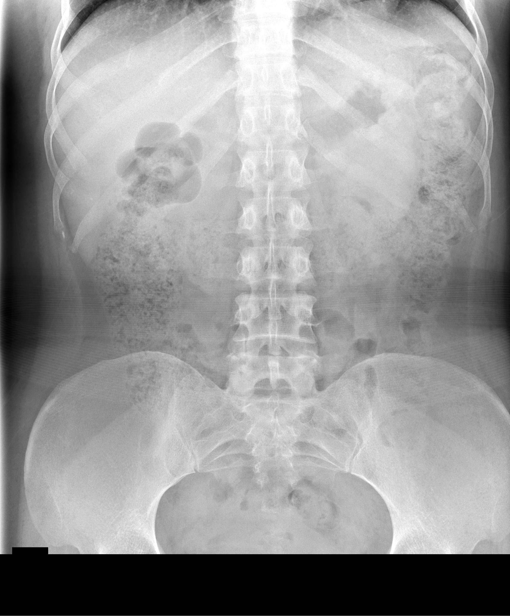

[fluoro_barium singleshot_bw (1 of 11)]
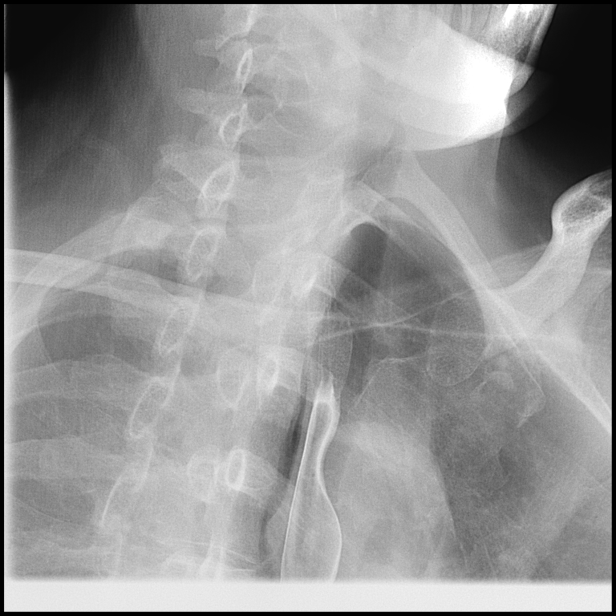

[fluoro_barium singleshot_bw (2 of 11)]
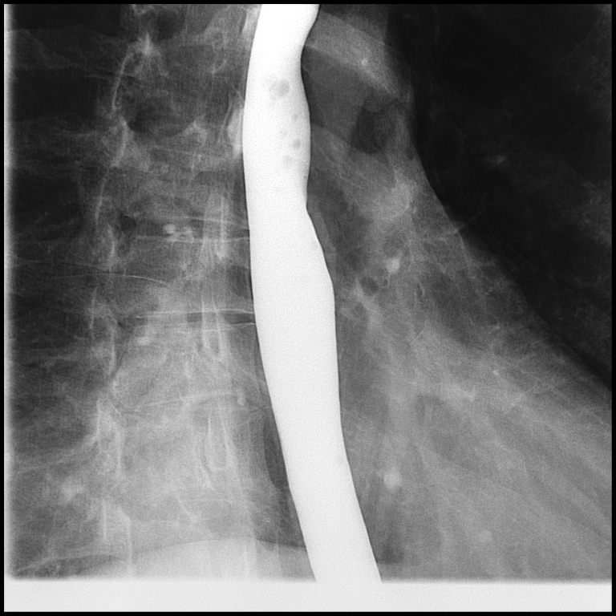

[fluoro_barium singleshot_bw (3 of 11)]
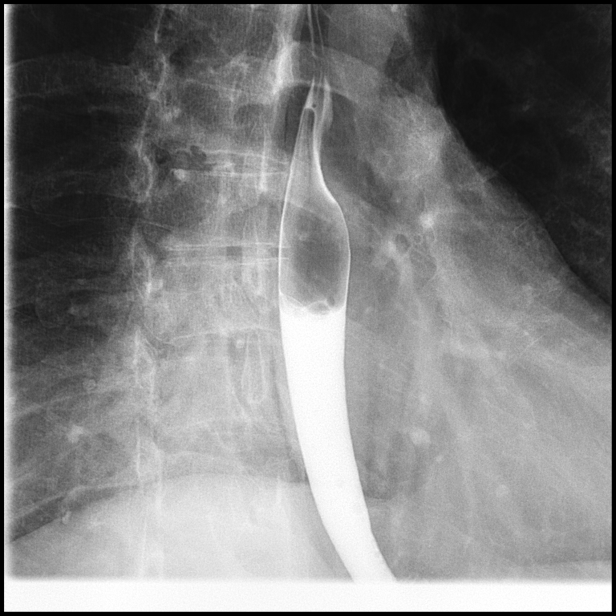

[fluoro_barium singleshot_bw (4 of 11)]
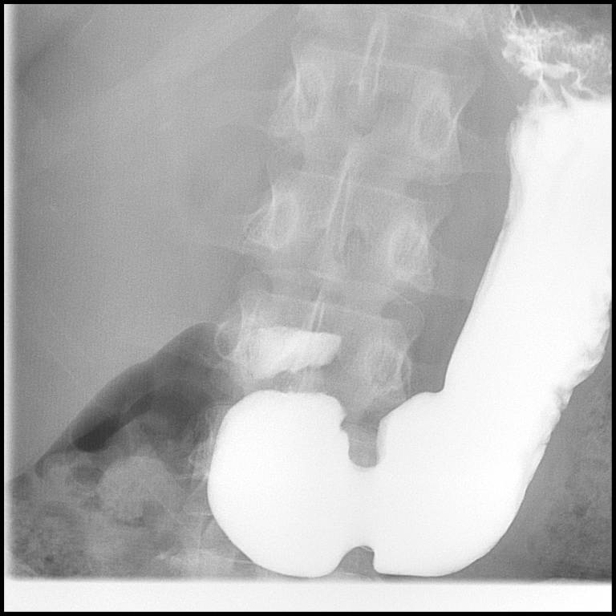

[fluoro_barium singleshot_bw (5 of 11)]
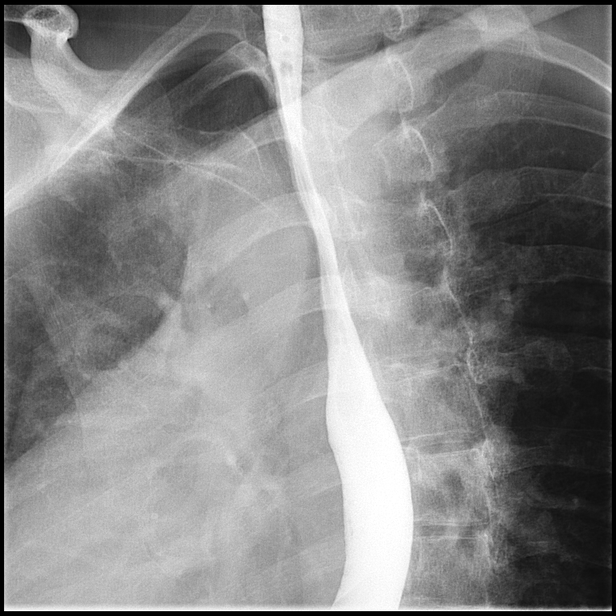

[fluoro_barium singleshot_bw (6 of 11)]
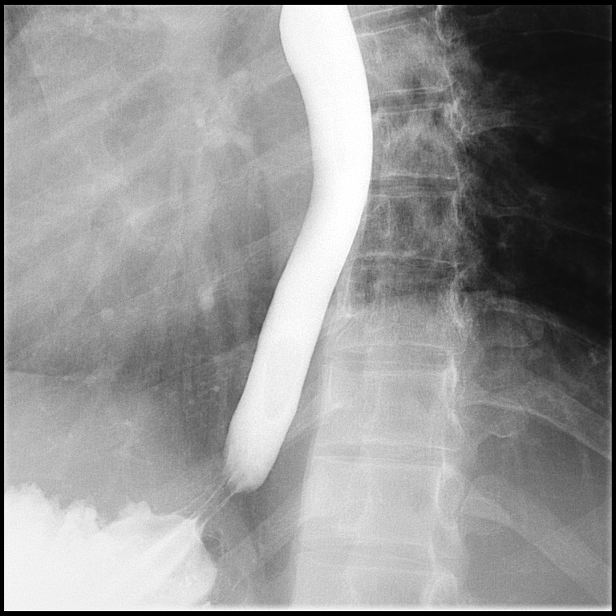

[fluoro_barium singleshot_bw (7 of 11)]
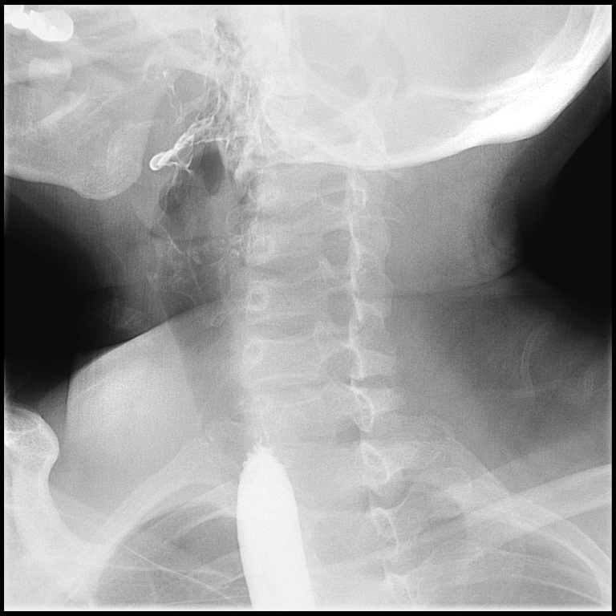

[fluoro_barium singleshot_bw (8 of 11)]
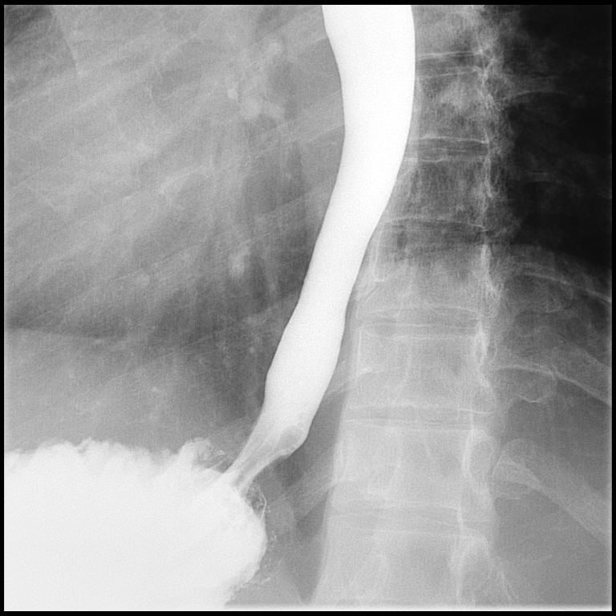

[fluoro_barium singleshot_bw (9 of 11)]
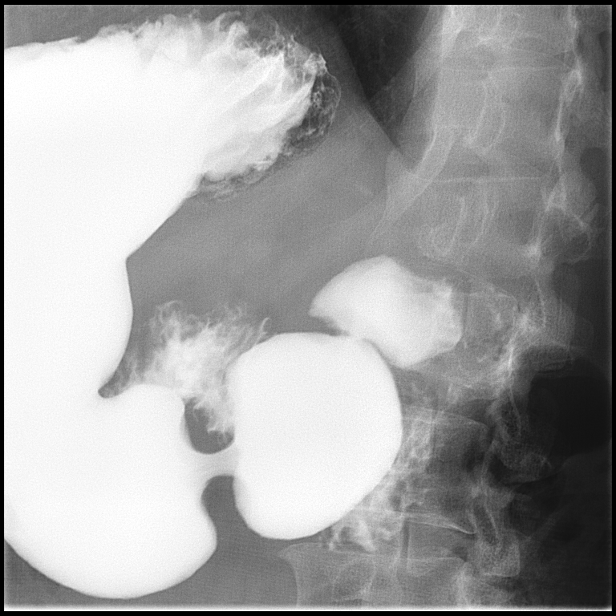

[fluoro_barium singleshot_bw (10 of 11)]
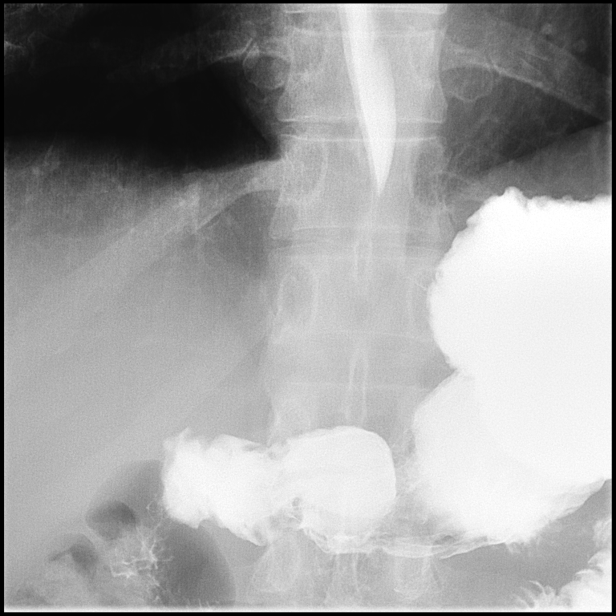

[fluoro_barium singleshot_bw (11 of 11)]
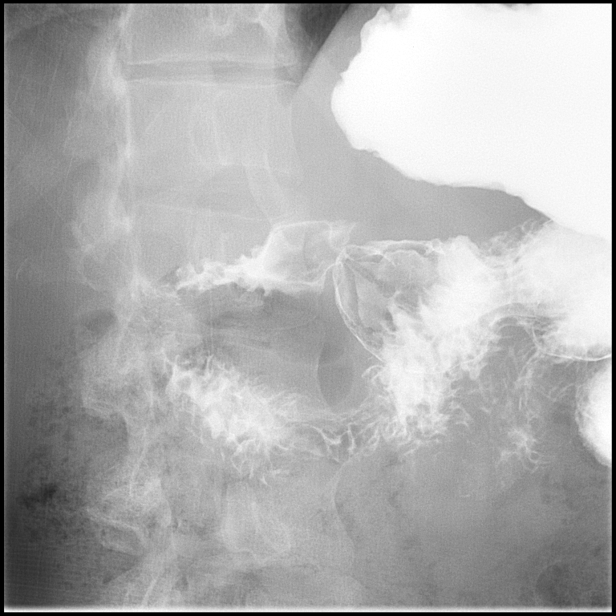

[t abdomen barium ap (1 of 3)]
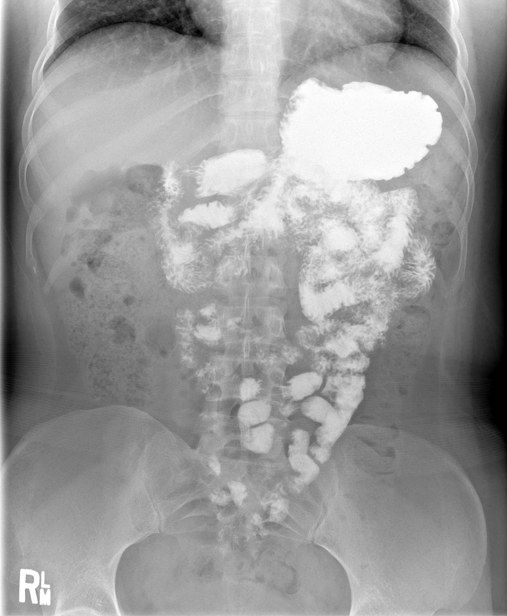

[t abdomen barium ap (2 of 3)]
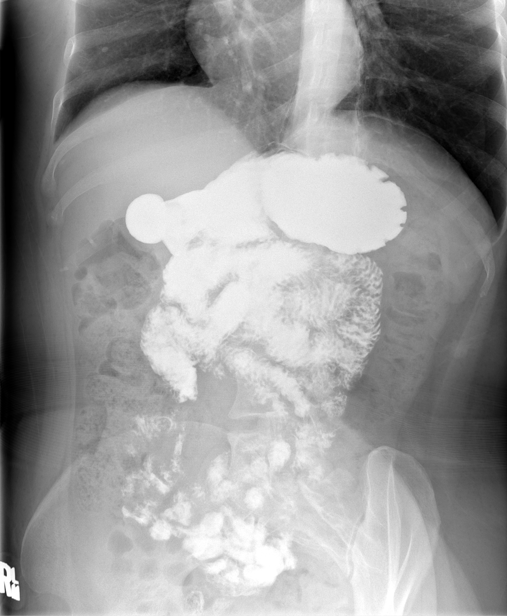

[t abdomen barium ap (3 of 3)]
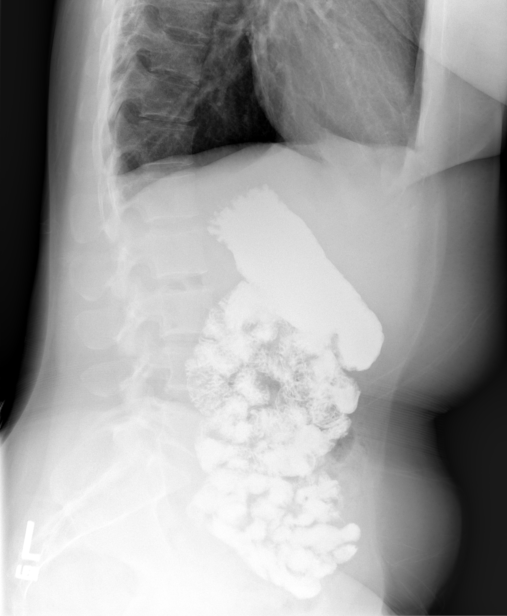

[15 of 22 positions shown; findings below may reference images not displayed]

FINDINGS: Preliminary abdomen radiograph shows diffuse stool throughout the
colon. The bowel gas pattern is unremarkable.

Esophagus, stomach, and duodenum were visualized. Swallowing and
peristalsis are normal. There is no hiatal hernia or stricture.
There is spontaneous reflux of a full column of barium to the
junction of the upper and mid thirds of the esophagus which empties
rather slowly. There is no esophageal mass or ulceration. Pharynx
appears unremarkable.

Stomach appears normal without mass or ulceration. Gastric folds
appears unremarkable. No duodenal mass or ulceration. Duodenal folds
appear normal.
IMPRESSION: Spontaneous gastroesophageal reflux to the junction of the upper and
mid thirds of the esophagus which empties rather slowly. No
esophageal mucosal lesion evident. No hiatal hernia or stricture.
Peristalsis normal. Stomach and duodenum appear normal.

## 2019-07-14 ENCOUNTER — Other Ambulatory Visit: Payer: Self-pay | Admitting: *Deleted

## 2019-07-14 DIAGNOSIS — Z20822 Contact with and (suspected) exposure to covid-19: Secondary | ICD-10-CM

## 2019-07-17 LAB — NOVEL CORONAVIRUS, NAA: SARS-CoV-2, NAA: NOT DETECTED

## 2020-06-07 ENCOUNTER — Other Ambulatory Visit: Payer: Self-pay

## 2020-06-07 ENCOUNTER — Emergency Department: Payer: BLUE CROSS/BLUE SHIELD

## 2020-06-07 ENCOUNTER — Emergency Department
Admission: EM | Admit: 2020-06-07 | Discharge: 2020-06-07 | Disposition: A | Discharge status: Home / Self Care | Payer: BLUE CROSS/BLUE SHIELD | Attending: Emergency Medicine | Admitting: Emergency Medicine

## 2020-06-07 ENCOUNTER — Encounter: Payer: Self-pay | Admitting: Intensive Care

## 2020-06-07 DIAGNOSIS — M7918 Myalgia, other site: Secondary | ICD-10-CM

## 2020-06-07 DIAGNOSIS — M545 Low back pain: Secondary | ICD-10-CM | POA: Diagnosis present

## 2020-06-07 MED ORDER — NAPROXEN 500 MG PO TABS
500.0000 mg | ORAL_TABLET | Freq: Once | ORAL | Status: AC
  Administered 2020-06-07: 500 mg via ORAL
  Filled 2020-06-07: qty 1

## 2020-06-07 MED ORDER — CYCLOBENZAPRINE HCL 10 MG PO TABS
10.0000 mg | ORAL_TABLET | Freq: Once | ORAL | Status: AC
  Administered 2020-06-07: 10 mg via ORAL
  Filled 2020-06-07: qty 1

## 2020-06-07 MED ORDER — MELOXICAM 15 MG PO TABS: 15.0000 mg | ORAL_TABLET | Freq: Every day | ORAL | 0 refills | Status: AC

## 2020-06-07 MED ORDER — CYCLOBENZAPRINE HCL 10 MG PO TABS: 5.0000 mg | ORAL_TABLET | Freq: Three times a day (TID) | ORAL | 0 refills | Status: AC | PRN
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
## 2020-06-07 NOTE — ED Notes (Signed)
Discharge instructions given per Rutherford Nail NP.

## 2020-06-07 NOTE — ED Notes (Signed)
Medication allergies verified via video interpreter.
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
## 2023-05-20 ENCOUNTER — Encounter: Payer: Self-pay | Admitting: Primary Care

## 2023-08-04 ENCOUNTER — Other Ambulatory Visit: Payer: Self-pay

## 2023-08-04 DIAGNOSIS — Z1231 Encounter for screening mammogram for malignant neoplasm of breast: Secondary | ICD-10-CM

## 2023-08-09 ENCOUNTER — Ambulatory Visit: Payer: Self-pay

## 2023-12-08 NOTE — Addendum Note (Signed)
 Addended by: Narda Rutherford on: 12/08/2023 01:16 PM   Modules accepted: Orders

## 2024-01-24 ENCOUNTER — Ambulatory Visit: Payer: Self-pay | Attending: Obstetrics and Gynecology | Admitting: *Deleted

## 2024-01-24 ENCOUNTER — Ambulatory Visit
Admission: RE | Admit: 2024-01-24 | Discharge: 2024-01-24 | Disposition: A | Discharge status: Home / Self Care | Payer: Self-pay | Source: Ambulatory Visit | Attending: Primary Care | Admitting: Primary Care

## 2024-01-24 VITALS — BP 97/68 | Wt 162.0 lb

## 2024-01-24 DIAGNOSIS — Z1239 Encounter for other screening for malignant neoplasm of breast: Secondary | ICD-10-CM

## 2024-01-24 DIAGNOSIS — Z1231 Encounter for screening mammogram for malignant neoplasm of breast: Secondary | ICD-10-CM | POA: Insufficient documentation

## 2024-01-24 NOTE — Patient Instructions (Signed)
 Explained breast self awareness with Joan Hall. Patient did not need a Pap smear today due to last Pap smear and HPV typing was 05/18/2023. Let her know BCCCP will cover Pap smears and HPV typing every 5 years unless has a history of abnormal Pap smears. Referred patient to the [facility] for a screening mammogram. Appointment scheduled Monday, Jan 24, 2024 at 1400. Patient aware of appointment and will be there. Let patient know Joan Hall will follow up with her within the next couple weeks with results of mammogram by letter or phone. Joan Hall verbalized understanding.  Mathilde Mcwherter, Dela Favor, RN 1:16 PM

## 2024-01-24 NOTE — Progress Notes (Signed)
 Ms. Jimena Y Miranda-Rivas is a 48 y.o. female who presents to [facility] clinic today with no complaints.    Pap Smear: Pap smear not completed today. Last Pap smear was 05/18/2023 at [facility] clinic and was normal with negative HPV. Per patient has no history of an abnormal Pap smear. Last Pap smear result is available in Epic.   Physical exam: Breasts Breasts symmetrical. No skin abnormalities bilateral breasts. No nipple retraction bilateral breasts. No nipple discharge bilateral breasts. No lymphadenopathy. No lumps palpated bilateral breasts. No complaints of pain or tenderness on exam.      Pelvic/Bimanual Pap is not indicated today per BCCCP guidelines.   Smoking History: Patient has never smoked.   Patient Navigation: Patient education provided. Access to services provided for patient through [facility] program. Spanish interpreter Baxter Bott from [facility] provided.   Colorectal Cancer Screening: Per patient has never had colonoscopy completed. Patient stated her PCP at [facility] gave her a FIT Test to complete that she needs to still do. Explained the importance of completing her FIT Test. No complaints today.    Breast and Cervical Cancer Risk Assessment: Patient does not have family history of breast cancer, known genetic mutations, or radiation treatment to the chest before age 32. Patient does not have history of cervical dysplasia, immunocompromised, or DES exposure in-utero.  Risk Scores as of Encounter on 01/24/2024     Gregary Lean           5-year 0.67%   Lifetime 7.56%   This patient is [place] but has no documented birth country, so the [place] model used data from [place] patients to calculate their risk score. Document a birth country in the Demographics activity for a more accurate score.         Last calculated by Algie Ingle, LPN on 2/72/5366 at  1:20 PM       A: BCCCP exam without pap smear No complaints.  P: Referred patient to the  [facility] for a screening mammogram. Appointment scheduled Monday, Jan 24, 2024 at 1400.  Stefan Edge, RN 01/24/2024 1:16 PM

## 2024-01-26 ENCOUNTER — Other Ambulatory Visit: Payer: Self-pay

## 2024-01-26 DIAGNOSIS — R928 Other abnormal and inconclusive findings on diagnostic imaging of breast: Secondary | ICD-10-CM

## 2024-02-02 ENCOUNTER — Ambulatory Visit
Admission: RE | Admit: 2024-02-02 | Discharge: 2024-02-02 | Disposition: A | Discharge status: Home / Self Care | Payer: Self-pay | Source: Ambulatory Visit | Attending: Obstetrics and Gynecology | Admitting: Obstetrics and Gynecology

## 2024-02-02 DIAGNOSIS — R928 Other abnormal and inconclusive findings on diagnostic imaging of breast: Secondary | ICD-10-CM
